# Patient Record
Sex: Female | Born: 1960 | Race: White | Hispanic: No | Marital: Married | Smoking: Former smoker
Health system: Southern US, Community
[De-identification: ages and names within clinical notes are randomized; demographics above are authoritative.]

## PROBLEM LIST (undated history)

## (undated) DIAGNOSIS — E119 Type 2 diabetes mellitus without complications: Secondary | ICD-10-CM

## (undated) DIAGNOSIS — I10 Essential (primary) hypertension: Secondary | ICD-10-CM

## (undated) HISTORY — PX: GASTRIC BYPASS: SHX52

## (undated) HISTORY — PX: CHOLECYSTECTOMY: SHX55

---

## 2013-05-11 DIAGNOSIS — E1159 Type 2 diabetes mellitus with other circulatory complications: Secondary | ICD-10-CM | POA: Insufficient documentation

## 2013-05-11 DIAGNOSIS — Z9884 Bariatric surgery status: Secondary | ICD-10-CM | POA: Insufficient documentation

## 2013-05-11 DIAGNOSIS — E669 Obesity, unspecified: Secondary | ICD-10-CM | POA: Insufficient documentation

## 2013-05-11 DIAGNOSIS — M545 Low back pain, unspecified: Secondary | ICD-10-CM | POA: Insufficient documentation

## 2014-02-13 ENCOUNTER — Ambulatory Visit: Payer: Self-pay | Admitting: Family Medicine

## 2014-03-04 DIAGNOSIS — E119 Type 2 diabetes mellitus without complications: Secondary | ICD-10-CM | POA: Insufficient documentation

## 2014-05-13 DIAGNOSIS — R002 Palpitations: Secondary | ICD-10-CM | POA: Insufficient documentation

## 2014-05-13 DIAGNOSIS — R42 Dizziness and giddiness: Secondary | ICD-10-CM | POA: Insufficient documentation

## 2014-05-13 DIAGNOSIS — R61 Generalized hyperhidrosis: Secondary | ICD-10-CM | POA: Insufficient documentation

## 2016-12-30 ENCOUNTER — Other Ambulatory Visit: Payer: Self-pay | Admitting: Gastroenterology

## 2016-12-30 DIAGNOSIS — K219 Gastro-esophageal reflux disease without esophagitis: Secondary | ICD-10-CM

## 2017-01-06 ENCOUNTER — Ambulatory Visit: Admission: RE | Admit: 2017-01-06 | Payer: Self-pay | Source: Ambulatory Visit

## 2018-03-13 ENCOUNTER — Encounter: Payer: Self-pay | Admitting: Emergency Medicine

## 2018-03-13 ENCOUNTER — Emergency Department
Admission: EM | Admit: 2018-03-13 | Discharge: 2018-03-13 | Disposition: A | Discharge status: Home / Self Care | Payer: BLUE CROSS/BLUE SHIELD | Attending: Emergency Medicine | Admitting: Emergency Medicine

## 2018-03-13 ENCOUNTER — Emergency Department: Payer: BLUE CROSS/BLUE SHIELD

## 2018-03-13 ENCOUNTER — Other Ambulatory Visit: Payer: Self-pay

## 2018-03-13 DIAGNOSIS — R61 Generalized hyperhidrosis: Secondary | ICD-10-CM | POA: Insufficient documentation

## 2018-03-13 DIAGNOSIS — I10 Essential (primary) hypertension: Secondary | ICD-10-CM | POA: Insufficient documentation

## 2018-03-13 DIAGNOSIS — Z87891 Personal history of nicotine dependence: Secondary | ICD-10-CM | POA: Insufficient documentation

## 2018-03-13 DIAGNOSIS — R42 Dizziness and giddiness: Secondary | ICD-10-CM

## 2018-03-13 DIAGNOSIS — E119 Type 2 diabetes mellitus without complications: Secondary | ICD-10-CM | POA: Diagnosis not present

## 2018-03-13 HISTORY — DX: Essential (primary) hypertension: I10

## 2018-03-13 HISTORY — DX: Type 2 diabetes mellitus without complications: E11.9

## 2018-03-13 LAB — BASIC METABOLIC PANEL
Anion gap: 11 (ref 5–15)
BUN: 28 mg/dL — AB (ref 6–20)
CALCIUM: 8.7 mg/dL — AB (ref 8.9–10.3)
CHLORIDE: 100 mmol/L — AB (ref 101–111)
CO2: 25 mmol/L (ref 22–32)
CREATININE: 0.85 mg/dL (ref 0.44–1.00)
GFR calc Af Amer: >60 mL/min (ref >60)
Glucose, Bld: 172 mg/dL — ABNORMAL HIGH (ref 65–99)
Potassium: 4 mmol/L (ref 3.5–5.1)
SODIUM: 136 mmol/L (ref 135–145)

## 2018-03-13 LAB — URINALYSIS, ROUTINE W REFLEX MICROSCOPIC
Bacteria, UA: NONE SEEN
Bilirubin Urine: NEGATIVE
GLUCOSE, UA: 50 mg/dL — AB
KETONES UR: 5 mg/dL — AB
Leukocytes, UA: NEGATIVE
NITRITE: NEGATIVE
PH: 6 (ref 5.0–8.0)
PROTEIN: 30 mg/dL — AB
SPECIFIC GRAVITY, URINE: 1.021 (ref 1.005–1.030)

## 2018-03-13 LAB — CBC
HCT: 41 % (ref 35.0–47.0)
HEMOGLOBIN: 13.9 g/dL (ref 12.0–16.0)
MCH: 31.1 pg (ref 26.0–34.0)
MCHC: 34 g/dL (ref 32.0–36.0)
MCV: 91.5 fL (ref 80.0–100.0)
PLATELETS: 303 10*3/uL (ref 150–440)
RBC: 4.48 MIL/uL (ref 3.80–5.20)
RDW: 13 % (ref 11.5–14.5)
WBC: 4.8 10*3/uL (ref 3.6–11.0)

## 2018-03-13 LAB — TSH: TSH: 1.529 u[IU]/mL (ref 0.350–4.500)

## 2018-03-13 LAB — GLUCOSE, CAPILLARY: Glucose-Capillary: 169 mg/dL — ABNORMAL HIGH (ref 65–99)

## 2018-03-13 LAB — TROPONIN I: Troponin I: <0.03 ng/mL (ref <0.03)

## 2018-03-13 MED ORDER — METOPROLOL TARTRATE 5 MG/5ML IV SOLN: 10.0000 mg | Freq: Once | INTRAVENOUS | Status: DC

## 2018-03-13 MED ORDER — METOPROLOL TARTRATE 25 MG PO TABS: 25.0000 mg | ORAL_TABLET | Freq: Two times a day (BID) | ORAL | 0 refills | Status: DC

## 2018-03-13 MED ORDER — METOPROLOL TARTRATE 25 MG PO TABS
25.0000 mg | ORAL_TABLET | Freq: Once | ORAL | Status: AC
  Administered 2018-03-13: 25 mg via ORAL
  Filled 2018-03-13: qty 1

## 2018-03-13 NOTE — Discharge Instructions (Addendum)
Please start taking the metoprolol as prescribed this evening.  Follow-up with your primary care physician for continued evaluation of your high blood pressure.  Return to the emergency department if you develop severe pain, lightheadedness or fainting, chest pain, shortness of breath, palpitations, fever, or any other symptoms concerning to you.

## 2018-03-13 NOTE — ED Notes (Signed)
Pt arrives to ED for hypertension at home with reported tachycardia to the 130's. Pt reports her PCP took her off her BP meds last week due to the cough she has been having for over 1 year. Pt denies being on any BP meds at this time. Pt reports dyspnea on exertion but denies any CP. Pt reports her heart has been racing occasionally. Pt is calm and cooperative. Pt denies N/V/D or fever/chills. VSS. Pt placed on cardiac monitor and call bell within reach. Will continue to monitor.

## 2018-03-13 NOTE — ED Provider Notes (Signed)
[facility] Emergency Department Provider Note  ____________________________________________  Time seen: Approximately 2:23 PM  I have reviewed the triage vital signs and the nursing notes.   HISTORY  Chief Complaint Hypertension    HPI Courtney Foley is a 57 y.o. female with a history of HTN and DM, chronic cough, presenting for diaphoresis and lightheadedness.  The patient reports that she has had a chronic cough for 3 years.  Her primary care physician has evaluated for multiple possible etiologies including infection, allergies, reflux, and medication causes of cough.  No causes for her cough have been found yet.  2 weeks ago, the patient's primary care physician discontinue lisinopril as one possible cause of her cough, but did not restart her on any other blood pressure medications.  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  She did not have any chest pain pressure or tightness.  She has not had any lower extremity swelling or calf pain.  She has not had any illness including fever or chills, congestion or rhinorrhea.  She checked her blood pressure and her systolic was over 200 so she came to the emergency department for further evaluation.  At this time, the patient feels back to baseline.  On my examination repeat blood pressure was 190/112.  Past Medical History:  Diagnosis Date  . Diabetes mellitus without complication (HCC)   . Hypertension     There are no active problems to display for this patient.   Past Surgical History:  Procedure Laterality Date  . GASTRIC BYPASS        Allergies Ibuprofen  No family history on file.  Social History Social History   Tobacco Use  . Smoking status: Former Games developer  . Smokeless tobacco: Never Used  Substance Use Topics  . Alcohol use: Yes    Comment: daily  . Drug use: Not on file    Review of  Systems Constitutional: No fever/chills.  Positive lightheadedness without syncope.  Positive diaphoresis. Eyes: No visual changes.  No blurred or double vision. ENT: No sore throat. No congestion or rhinorrhea.  No ear pain. Cardiovascular: Denies chest pain. Denies palpitations. Respiratory: Positive shortness of breath.  Positive chronic unchanged cough. Gastrointestinal: No abdominal pain.  No nausea, no vomiting.  No diarrhea.  No constipation. Genitourinary: Negative for dysuria. Musculoskeletal: Negative for back pain.  No lower extremity swelling or calf pain. Skin: Negative for rash. Neurological: Negative for headaches. No focal numbness, tingling or weakness.  No changes in vision, speech or mental status.  No difficulty walking.    ____________________________________________   PHYSICAL EXAM:  VITAL SIGNS: ED Triage Vitals [03/13/18 1141]  Enc Vitals Group     BP (!) 169/106     Pulse Rate (!) 110     Resp 18     Temp 97.6 F (36.4 C)     Temp Source Oral     SpO2 97 %     Weight 220 lb (99.8 kg)     Height  (1.651 m)     Head Circumference      Peak Flow      Pain Score 0     Pain Loc      Pain Edu?      Excl. in GC?     Constitutional: Alert and oriented.  Chronically ill appearing but in no acute distress. Answers questions appropriately. Eyes: Conjunctivae are  normal.  EOMI. No scleral icterus. Head: Atraumatic. Nose: No congestion/rhinnorhea. Mouth/Throat: Mucous membranes are moist.  Neck: No stridor.  Supple.  No JVD.  No meningismus. Cardiovascular: Normal rate, regular rhythm. No murmurs, rubs or gallops.  Respiratory: Normal respiratory effort.  No accessory muscle use or retractions. Lungs CTAB.  No wheezes, rales or ronchi. Gastrointestinal: Morbidly obese.  Soft, nontender and nondistended.  No guarding or rebound.  No peritoneal signs. Musculoskeletal: No LE edema. No ttp in the calves or palpable cords.  Negative Homan's  sign. Neurologic:  A&Ox3.  Speech is clear.  Face and smile are symmetric.  EOMI.  Moves all extremities well. Skin:  Skin is warm, dry and intact. No rash noted. Psychiatric: Mood and affect are normal. Speech and behavior are normal.  Normal judgement  ____________________________________________   LABS (all labs ordered are listed, but only abnormal results are displayed)  Labs Reviewed  BASIC METABOLIC PANEL - Abnormal; Notable for the following components:      Result Value   Chloride 100 (*)    Glucose, Bld 172 (*)    BUN 28 (*)    Calcium 8.7 (*)    All other components within normal limits  URINALYSIS, ROUTINE W REFLEX MICROSCOPIC - Abnormal; Notable for the following components:   Color, Urine YELLOW (*)    APPearance HAZY (*)    Glucose, UA 50 (*)    Hgb urine dipstick SMALL (*)    Ketones, ur 5 (*)    Protein, ur 30 (*)    All other components within normal limits  GLUCOSE, CAPILLARY - Abnormal; Notable for the following components:   Glucose-Capillary 169 (*)    All other components within normal limits  CBC  TSH  TROPONIN I  CBG MONITORING, ED   ____________________________________________  EKG  ED ECG REPORT I, Rockne Menghini, the attending physician, personally viewed and interpreted this ECG.   Date: 03/13/2018  EKG Time: 1152  Rate: 106  Rhythm: sinus tachycardia  Axis: leftward  Intervals:none  ST&T Change: no STEMI  ____________________________________________  RADIOLOGY  Dg Chest 2 View  Result Date: 03/13/2018 CLINICAL DATA:  Hypertension and tachycardia EXAM: CHEST - 2 VIEW COMPARISON:  None. FINDINGS: The heart size and mediastinal contours are within normal limits. Both lungs are clear. The visualized skeletal structures are unremarkable. IMPRESSION: No active cardiopulmonary disease. Electronically Signed   By: Alcide Clever M.D.   On: 03/13/2018 15:43     ____________________________________________   PROCEDURES  Procedure(s) performed: None  Procedures  Critical Care performed: No ____________________________________________   INITIAL IMPRESSION / ASSESSMENT AND PLAN / ED COURSE  Pertinent labs & imaging results that were available during my care of the patient were reviewed by me and considered in my medical decision making (see chart for details).  57 y.o. female with a history of hypertension, recently with antihypertensive discontinued, presenting with an episode of diaphoresis, lightheadedness and shortness of breath.  Overall, the patient is markedly hypertensive here.  She does not have any signs of ischemia on her EKG although she was in sinus tachycardia; at this time she is in normal sinus rhythm.  There is no evidence for ischemia, and her troponin is pending.  She is not hypoxic and has no evidence of DVT; PE is very unlikely.  It is possible that the patient had an episode of symptomatic hypertension this morning and we will treat her with metoprolol.  The patient's CBC shows normal blood counts, her electrolytes with the exception  of hyperglycemia reassuring.  She does not have evidence of infection on her urinalysis.  We will plan to treat the patient's blood pressure, and continue her diagnostic work-up.  Plan reevaluation for final disposition.  I have asked the clerk to call her primary care physician for final blood pressure medication management.  ----------------------------------------- 3:47 PM on 03/13/2018 -----------------------------------------  I spoken with Dr. Nigel Mormon, the patient's PMD, who agrees with the plan of initiating metoprolol with follow-up within the next 5 to 7 days.  ____________________________________________  FINAL CLINICAL IMPRESSION(S) / ED DIAGNOSES  Final diagnoses:  Diaphoresis  Hypertension, unspecified type  Lightheadedness         NEW MEDICATIONS STARTED DURING  THIS VISIT:  New Prescriptions   METOPROLOL TARTRATE (LOPRESSOR) 25 MG TABLET    Take 1 tablet (25 mg total) by mouth 2 (two) times daily.      Rockne Menghini, MD 03/13/18 1547

## 2018-03-13 NOTE — ED Triage Notes (Signed)
Pt states dry cough for about 3 years now, was taken off bp med 2 weeks ago to see if that was the cause of her cough, pt coughing in triage, diaphoretic, bp elevated. Denies pain, appears in NAD.

## 2018-09-12 IMAGING — CR DG CHEST 2V
2 series · 2 of 2 positions shown · non-contrast
Comparison: None.

CLINICAL DATA: Hypertension and tachycardia

EXAM:
CHEST - 2 VIEW

[chest pa]
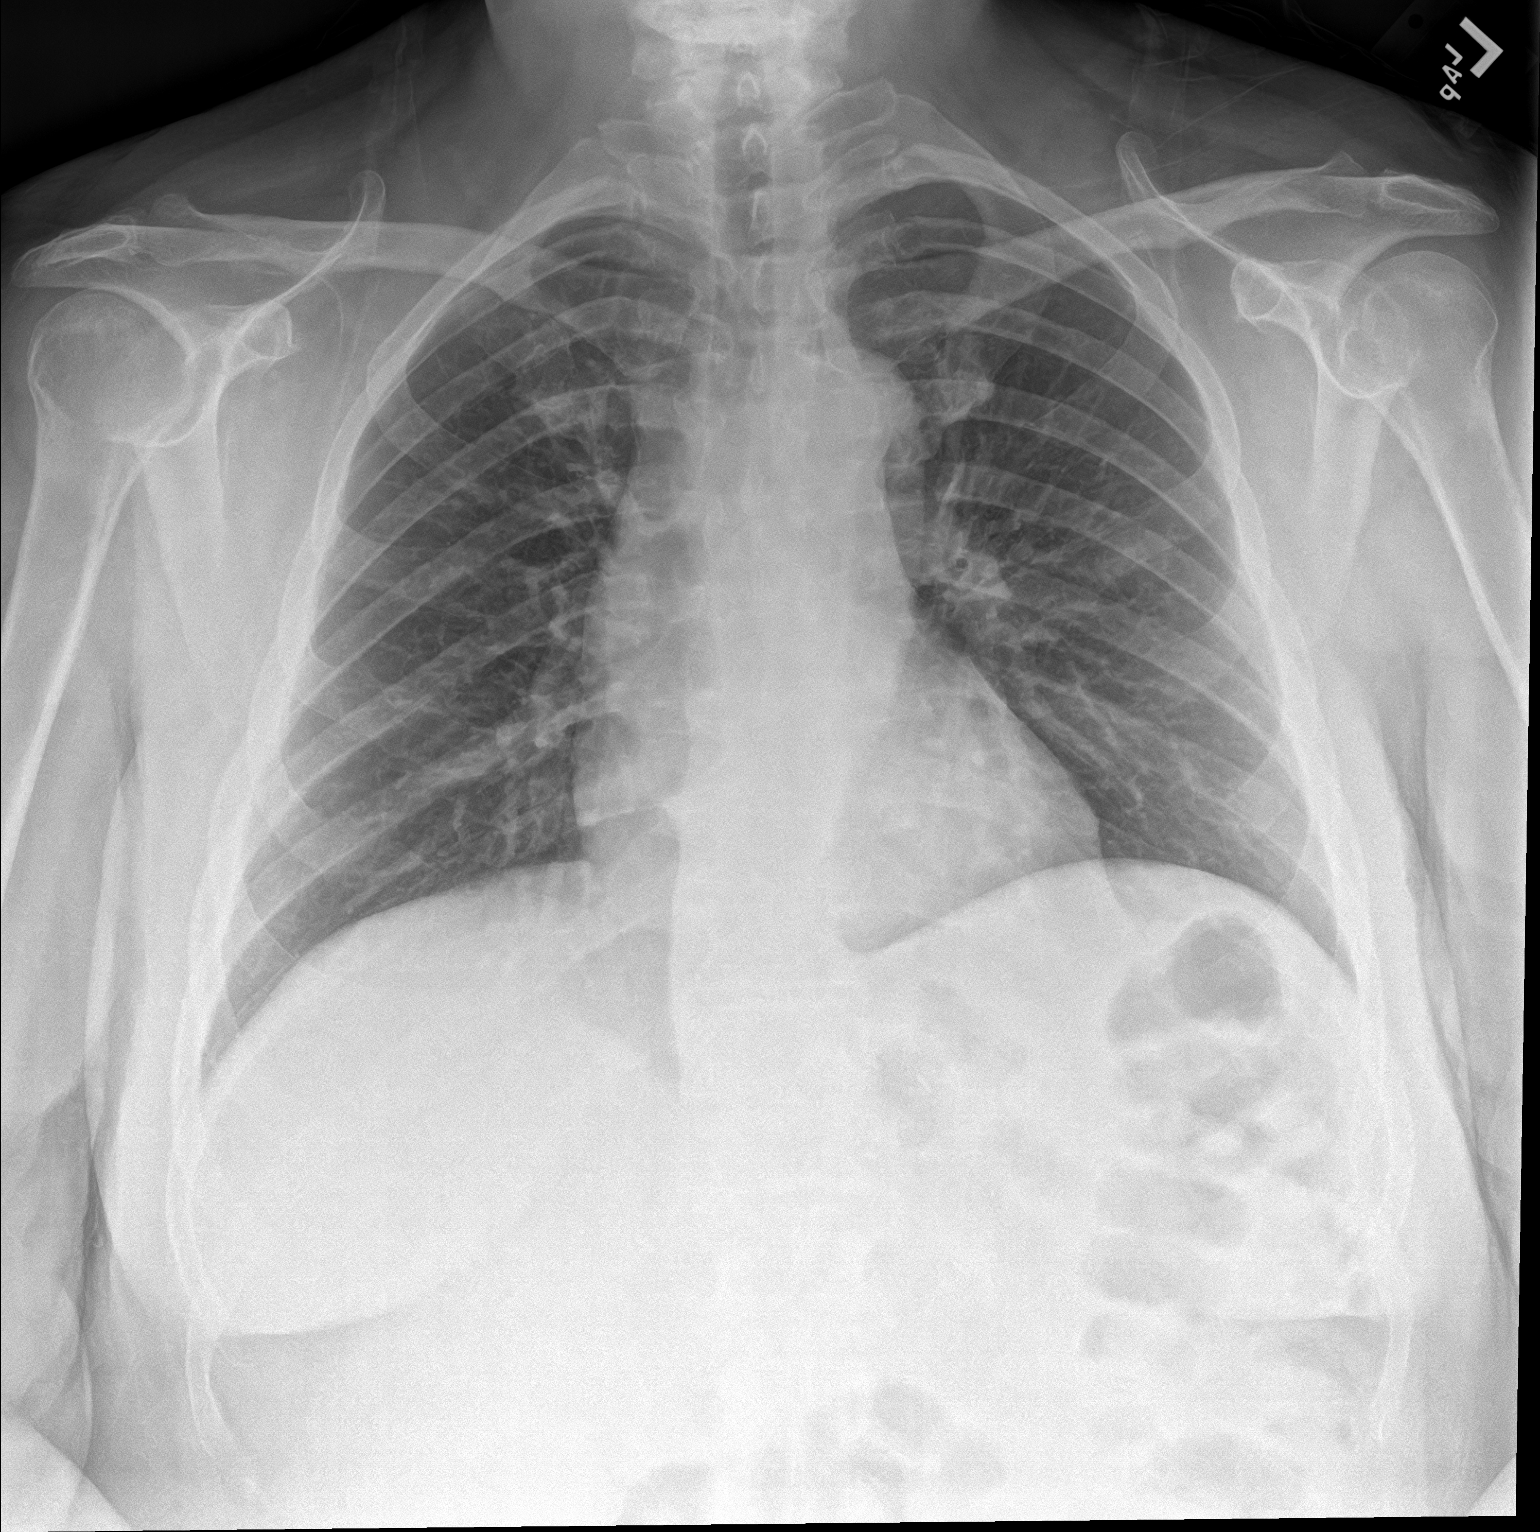

[chest lat]
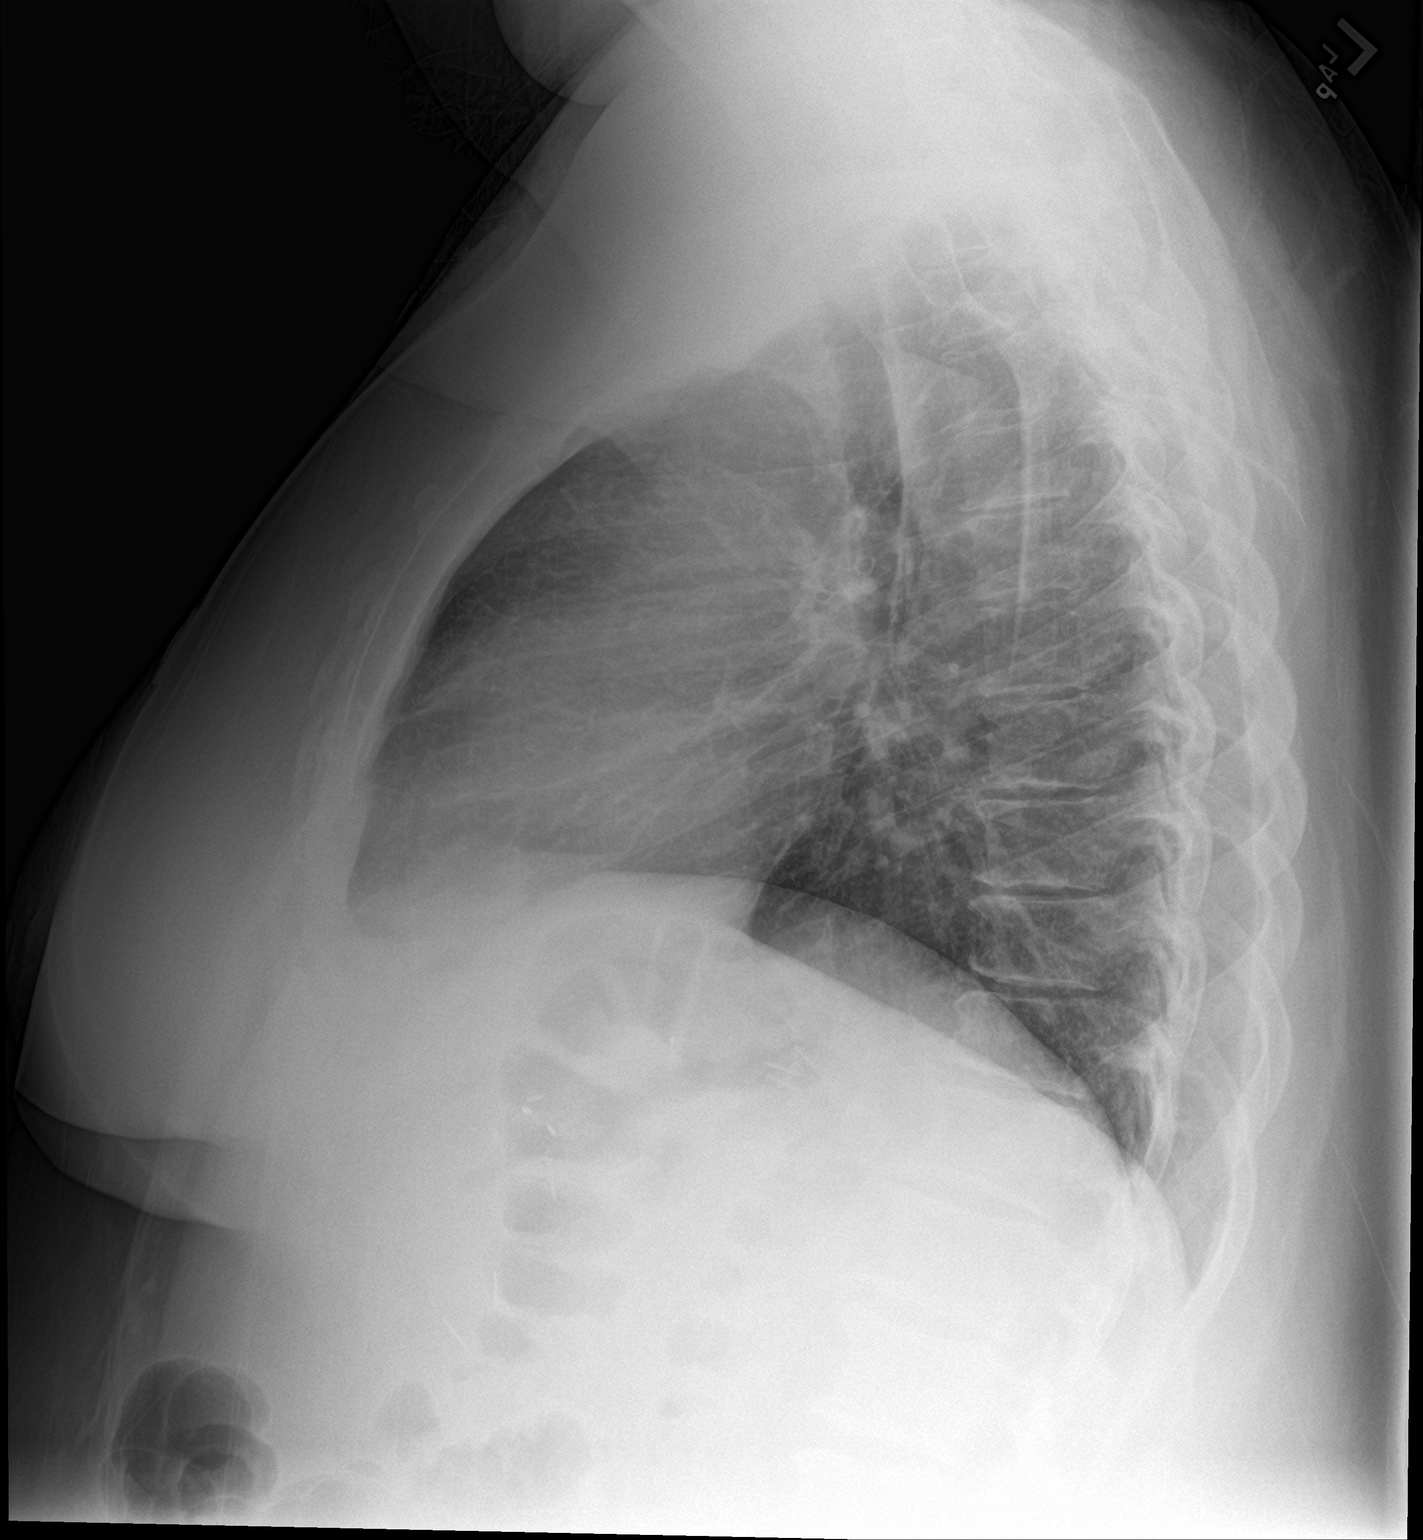

[2 of 2 positions shown; findings below may reference images not displayed]

FINDINGS: The heart size and mediastinal contours are within normal limits.
Both lungs are clear. The visualized skeletal structures are
unremarkable.
IMPRESSION: No active cardiopulmonary disease.

## 2020-12-17 DIAGNOSIS — K828 Other specified diseases of gallbladder: Secondary | ICD-10-CM | POA: Insufficient documentation

## 2021-09-27 DIAGNOSIS — Z0000 Encounter for general adult medical examination without abnormal findings: Secondary | ICD-10-CM | POA: Insufficient documentation

## 2022-01-04 DIAGNOSIS — E1169 Type 2 diabetes mellitus with other specified complication: Secondary | ICD-10-CM | POA: Insufficient documentation

## 2022-06-11 DIAGNOSIS — R1110 Vomiting, unspecified: Secondary | ICD-10-CM | POA: Insufficient documentation

## 2023-04-11 ENCOUNTER — Other Ambulatory Visit: Payer: Self-pay

## 2023-04-14 NOTE — Progress Notes (Signed)
Celso Amy, PA-C [address]  [address]  [place], [place] [address]  Main: [phone]  Fax: [phone]   Gastroenterology Consultation  Referring Provider:     Maree Erie, MD Primary Care Physician:  Maree Erie, MD Primary Gastroenterologist:  Celso Amy, PA-C / Dr. Wyline Mood  Reason for Consultation:     Chronic vomiting, chronic diarrhea, chronic abdominal pain        HPI:   Courtney Foley is a 62 y.o. y/o female referred for consultation & management  by Maree Erie, MD. PCP is at [facility] in [place].  Here to evaluate vomiting.  Drinks moderate alcohol 4 times per week.  1 bottle of wine per day.  Former cigarette smoker.  Nausea improved after stopping metformin.  Takes cholestyramine for chronic diarrhea.  History of Roux-en-Y gastric bypass in 2005.  History of chronic vomiting.  Has hypertension, hyperlipidemia, type 2 diabetes with long-term use of insulin.  Last saw PCP 03/18/2023 and started [facility] for weight loss.  Last EGD 06/2016 (for chronic vomiting and GERD) -LA grade B esophagitis, gastric bypass, gastrojejunal anastomosis was characterized by edema, erythema, friable mucosa, and inflammation.  Normal jejunum.  Performed by Dr. Andrey Farmer through [facility].  Was treated with Nexium 40 Mg daily.  Last colonoscopy 06/2016 (to evaluate chronic diarrhea) -sigmoid diverticulosis, otherwise normal.  Biopsies negative for microscopic colitis.   Colonoscopy in 2015 showed 9 mm polyp removed from sigmoid colon.  Abdominal MRI/MRCP 12/2020 (to evaluate chronic abdominal pain) showed diffuse hepatic steatosis, contracted gallbladder, cholelithiasis, porcelain gallbladder.  No evidence of cholecystitis.  Vascular shunt in the right hepatic lobe.  She underwent cholecystectomy 01/2021.  Had increased diarrhea after that.  Current symptoms: She reports generalized abdominal pain all over her entire upper and lower  abdomen bilaterally.  She has persistent episodes of nausea, vomiting, and diarrhea.  She denies rectal bleeding or weight loss.  No recent antibiotic use.  She has well water.  She is taking B12 injection.  No other vitamin supplements.  Past Medical History:  Diagnosis Date   Diabetes mellitus without complication (HCC)    Hypertension     Past Surgical History:  Procedure Laterality Date   GASTRIC BYPASS      Prior to Admission medications   Medication Sig Start Date End Date Taking? Authorizing Provider  metoprolol tartrate (LOPRESSOR) 25 MG tablet Take 1 tablet (25 mg total) by mouth 2 (two) times daily. 03/13/18 03/13/19  Rockne Menghini, MD    No family history on file.   Social History   Tobacco Use   Smoking status: Former   Smokeless tobacco: Never  Substance Use Topics   Alcohol use: Yes    Comment: daily    Allergies as of 04/15/2023 - Review Complete 03/13/2018  Allergen Reaction Noted   Ibuprofen Nausea And Vomiting and Other (See Comments) 05/11/2013   Lisinopril Cough 09/22/2019   Amlodipine Cough 11/12/2022   Losartan Cough 11/12/2022   Metformin Other (See Comments) 11/12/2022   Nickel Rash 05/13/2016   Statins Other (See Comments) 11/12/2022    Review of Systems:    All systems reviewed and negative except where noted in HPI.   Physical Exam:  There were no vitals taken for this visit. No LMP recorded. Patient is postmenopausal. Psych:  Alert and cooperative. Normal mood and affect. General:   Alert,  Well-developed, well-nourished, pleasant and cooperative in NAD Head:  Normocephalic and atraumatic. Eyes:  Sclera clear, no icterus.   Conjunctiva pink. Neck:  Supple; no masses or thyromegaly. Lungs:  Respirations even and unlabored.  Clear throughout to auscultation.   No wheezes, crackles, or rhonchi. No acute distress. Heart:  Regular rate and rhythm; no murmurs, clicks, rubs, or gallops. Abdomen:  Normal bowel sounds.  No bruits.  Soft,  and non-distended without masses, hepatosplenomegaly or hernias noted.  No Tenderness.  No guarding or rebound tenderness.    Neurologic:  Alert and oriented x3;  grossly normal neurologically. Psych:  Alert and cooperative. Normal mood and affect.  Imaging Studies: No results found.  Assessment and Plan:   Courtney Foley is a 62 y.o. y/o female has been referred for chronic nausea, vomiting, diarrhea, and abdominal pain for many years.  Differential includes irritable bowel syndrome, bile salt diarrhea postcholecystectomy, infectious diarrhea, pancreatic insufficiency, adverse effects of alcohol, diabetic gastroparesis, adhesions from abdominal surgery.  Last colonoscopy in 2017 showed biopsies negative for microscopic colitis.  No evidence of IBD.  EGD in 2017 unrevealing.  I am starting her workup with lab work, stool studies, and gastric emptying test.  Also giving dicyclomine.  Continue cholestyramine.  Strongly encouraged her to stop alcohol.  1.  Chronic vomiting  Gastric emptying study -evaluate for gastroparesis  Possible side effect of Mounjaro?  2.  Chronic diarrhea  Continue cholestyramine  Previous colonoscopy negative for microscopic colitis  Stool studies: GI pathogen panel, C. difficile PCR, pancreatic fecal elastase.  Labs: Celiac  3.  Chronic abdominal pain -differential includes IBS versus adhesions from abdominal surgery  Labs: CBC, CMP, lipase  4.  History of gastric bypass in 2005; cholecystectomy in 2022  Labs: Vitamin D, B12, magnesium, iron  5.  Hepatic steatosis   6.  Moderate alcohol use  Lengthy discussion regarding abstinence from alcohol.  Discussed resources such as Alcoholics Anonymous and fellowship all.  Follow up in 4 weeks with TG.  Celso Amy, PA-C

## 2023-04-15 ENCOUNTER — Ambulatory Visit: Payer: BLUE CROSS/BLUE SHIELD | Admitting: Physician Assistant

## 2023-04-15 ENCOUNTER — Encounter: Payer: Self-pay | Admitting: Physician Assistant

## 2023-04-15 VITALS — BP 142/98 | HR 105 | Temp 98.3°F | Ht 65.0 in | Wt 232.0 lb

## 2023-04-15 DIAGNOSIS — R1084 Generalized abdominal pain: Secondary | ICD-10-CM

## 2023-04-15 DIAGNOSIS — F1010 Alcohol abuse, uncomplicated: Secondary | ICD-10-CM

## 2023-04-15 DIAGNOSIS — R197 Diarrhea, unspecified: Secondary | ICD-10-CM

## 2023-04-15 DIAGNOSIS — R112 Nausea with vomiting, unspecified: Secondary | ICD-10-CM | POA: Diagnosis not present

## 2023-04-15 DIAGNOSIS — Z9884 Bariatric surgery status: Secondary | ICD-10-CM | POA: Diagnosis not present

## 2023-04-15 DIAGNOSIS — K760 Fatty (change of) liver, not elsewhere classified: Secondary | ICD-10-CM

## 2023-04-15 MED ORDER — DICYCLOMINE HCL 10 MG PO CAPS
10.0000 mg | ORAL_CAPSULE | Freq: Three times a day (TID) | ORAL | 2 refills | Status: AC
Start: 2023-04-15 — End: 2023-07-14

## 2023-04-15 NOTE — Patient Instructions (Signed)
Please do not eat or drink after midnight the night before your image test.

## 2023-04-19 ENCOUNTER — Telehealth: Payer: Self-pay

## 2023-04-19 NOTE — Telephone Encounter (Signed)
Called [facility] and they pended the case because of the facility being out of network. They said the provider would have to call at [phone] option 1,1, 2 they said the facility is out of network pending case number is [id number]. . They could not give me another facility that would be in network either

## 2023-04-22 NOTE — Telephone Encounter (Signed)
Called to check to see if they approved the facility and they did not they denied [facility]

## 2023-04-25 NOTE — Telephone Encounter (Signed)
[facility] and they do perform gastric emptying study through radiology and there number is [phone].  There NPI number is [id number] and fax number is [phone] . Called patient because patient insurance is out of network for [place] and this was not caught at check in. Called and informed patient and informed patient she had to go to a [facility] provider or she might get a out of nextwork charge. She asked how much she is going to have to pay for the visit from 04/15/2023 looked and it is still pending with insurance. She is going to call referring provider and get them to refer her to [facility].,  Did call [facility] and get the gastric emptying study approved from 04/25/2023 to 05/24/2023 Order number [id number].  Canceled follow up appointment and gastric emptying study with [place]

## 2023-05-12 ENCOUNTER — Telehealth: Payer: Self-pay

## 2023-05-12 NOTE — Telephone Encounter (Signed)
Courtney Foley left a message on voicemail to get permission to cancel to Gastric emptying study since insurance is out of network for [place]. Called donna back and informed her yes we can cancel the order

## 2023-05-16 ENCOUNTER — Ambulatory Visit: Payer: BLUE CROSS/BLUE SHIELD | Admitting: Physician Assistant

## 2024-04-11 LAB — COLOGUARD: COLOGUARD: NEGATIVE

## 2024-04-19 ENCOUNTER — Ambulatory Visit: Attending: Family Medicine

## 2024-04-19 DIAGNOSIS — R42 Dizziness and giddiness: Secondary | ICD-10-CM | POA: Insufficient documentation

## 2024-04-19 DIAGNOSIS — M6281 Muscle weakness (generalized): Secondary | ICD-10-CM | POA: Diagnosis present

## 2024-04-19 DIAGNOSIS — R262 Difficulty in walking, not elsewhere classified: Secondary | ICD-10-CM | POA: Insufficient documentation

## 2024-04-19 DIAGNOSIS — R2681 Unsteadiness on feet: Secondary | ICD-10-CM | POA: Diagnosis present

## 2024-04-19 DIAGNOSIS — M542 Cervicalgia: Secondary | ICD-10-CM | POA: Insufficient documentation

## 2024-04-19 NOTE — Therapy (Signed)
 OUTPATIENT PHYSICAL THERAPY VESTIBULAR TREATMENT     Patient Name: Courtney Foley MRN: [id number] DOB:04-20-1961, 63 y.o., female Today's Date: 04/19/2024  END OF SESSION:  PT End of Session - 04/19/24 1418     Visit Number 1    Number of Visits 25    Date for PT Re-Evaluation 07/12/24    PT Start Time 1151    PT Stop Time 1230    PT Time Calculation (min) 39 min    Activity Tolerance Patient tolerated treatment well    Behavior During Therapy Restless          Past Medical History:  Diagnosis Date   Diabetes mellitus without complication (HCC)    Hypertension    Past Surgical History:  Procedure Laterality Date   CHOLECYSTECTOMY     GASTRIC BYPASS     Patient Active Problem List   Diagnosis Date Noted   Chronic vomiting 06/11/2022   Hyperlipidemia associated with type 2 diabetes mellitus (HCC) 01/04/2022   Routine health maintenance 09/27/2021   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   Diaphoresis 05/13/2014   Dizziness 05/13/2014   Palpitations 05/13/2014   Diabetes mellitus (HCC) 03/04/2014   Hypertension associated with diabetes (HCC) 05/11/2013   Low back pain 05/11/2013   Obesity 05/11/2013   S/P gastric bypass 05/11/2013    PCP: Nancie Axe, MD REFERRING PROVIDER: Kurtis Philips, MD  REFERRING DIAG:  Diagnosis  R42 (ICD-10-CM) - Vertigo    THERAPY DIAG:  Dizziness and giddiness  Muscle weakness (generalized)  Unsteadiness on feet  Difficulty in walking, not elsewhere classified  ONSET DATE: 2023  Rationale for Evaluation and Treatment: Rehabilitation  SUBJECTIVE:   SUBJECTIVE STATEMENT:  The pt is a pleasant 63 y/o female presenting to PT for dizziness and imbalance. Pt reports symptom onset two years ago. Pt says nothing conclusive was found at recent ENT visit (she describes being tested for BPPV deficit/positional maneuvers). She reports nothing found on imaging during hospitalization (5/7) where pt had presented to ED with  positive HITNS exam (per chart). Pt reports CRM was provided by ENT and did make symptoms a little better. Pt says over past two years she's developed a few different symptoms: two years ago she started experiencing lots of pain, which turned into weakness and difficulty walking. She had some difficulty moving R shoulder as well (has had injections for chronic pain multiple sites). In May this year she reports her eyesight got screwed up, where she was seeing double and she had vertigo. Pt states, lights on the ceiling were down on the floor, and everything made her feel nauseated to the point she had to shut her eyes. She was given Meclizine at the time to tx symptoms and this helped some. Her vision has gradually improved since. Pt reports she does experience some headaches...about once a month and they don't last long. Pt reports hearing loss but has not had hearing tested. Her spouse is present for exam and reports he has noticed pt cannot hear him as well.. Regarding dizziness, pt reports she doesn't really experience spinning sensation, but doesn't like to be moved quickly as this can increase her symptoms. Pt reports she does experience light sensitivity during episodes and that she can become so nauseated she vomits. She has poor balance, particularly in the dark. She cannot close her eyes in the shower due to LOB. Other symptoms: pt reports bilateral tinnitus, increased ear wax with recent L ear wax removal that might have helped symptoms.   Pt accompanied  by: spouse  PERTINENT HISTORY:   Per chart PMH significant for DMII (on insulin), HTN, hx gastric bypass, chronic pain per pt report  PAIN:  Are you having pain? Pt does have neck pain, more on the R side than L side. She describes it has sharp. Pt reports pain comes and goes, is unsure how long she has had that for  PRECAUTIONS: Sternal  RED FLAGS: Pt reports changes in ability to swallow water and pills over past year; she also reports  urinary accidents >1 year but not as bad as before, states she has discussed with physicians    WEIGHT BEARING RESTRICTIONS: No  FALLS: Has patient fallen in last 6 months? No but she reports it's because she's been more cautious, moves sloly,, prior to that she was falling multiple times/week   LIVING ENVIRONMENT: Lives with: spouse, pets  Stairs: 2-3 with handrail on R  Has following equipment at home: Single point cane and Walker - 2 wheeled, grab bars in the shower and next to toilet, has a shower chair, has toilet riser  PLOF: Independent  PATIENT GOALS: to be able to walk better without constantly feeling she is going to fall, be able to balance in darkness, states she wants to be normal  OBJECTIVE:  Note: Objective measures were completed at Evaluation unless otherwise noted.  DIAGNOSTIC FINDINGS:   MRI BRAIN 03/07/24:  FINDINGS:   There are scattered and confluent foci of signal abnormality within the periventricular and deep white matter.  These are nonspecific but commonly seen with small vessel ischemic changes. Mild global parenchymal volume loss with ex vacuo ventricular dilation. Prominent perivascular spaces in the inferior basal ganglia bilaterally. There is no midline shift. No extra-axial fluid collection. No evidence of intracranial hemorrhage. No diffusion weighted signal abnormality to suggest acute infarct. No mass.  IMPRESSION: No acute intracranial abnormality. Chronic small vessel ischemic changes. Exam End: 03/07/24 17:00   Specimen Collected: 03/07/24 17:08 Last Resulted: 03/07/24 17:36  Received From: [facility]  Result Received: 04/17/24 13:14    ECG 03/07/24 Lyanne Sample, MD - 03/08/2024 Formatting of this note might be different from the original. SINUS RHYTHM WITH PREMATURE ATRIAL BEATS LEFT ANTERIOR FASCICULAR BLOCK MODERATE VOLTAGE CRITERIA FOR LVH, MAY BE NORMAL VARIANT ( R in aVL , [employer] product ) PROLONGED QT ABNORMAL  ECG WHEN COMPARED WITH ECG OF 28-Nov-2023 21:38, PREMATURE ATRIAL BEATS ARE NOW PRESENT BORDERLINE CRITERIA FOR ANTERIOR INFARCT  ARE NO LONGER PRESENT BORDERLINE CRITERIA FOR ANTEROLATERAL INFARCT  ARE NO LONGER PRESENT Confirmed by Antonetta Kitchen ([id number]) on 03/08/2024 11:35:52 AM Specimen Collected: 03/07/24 14:49   Performed by: [facility] RAD Last Resulted: 03/08/24 11:35  Received From: [facility]  Result Received: 04/17/24 13:14    COGNITION: Overall cognitive status: Within functional limits for tasks assessed     POSTURE:  Rounded shoulders, increased thoracic kyphosis   Cervical ROM:   Deferred  STRENGTH:   LOWER EXTREMITY MMT: deferred  MMT Right eval Left eval  Hip flexion    Hip abduction    Hip adduction    Hip internal rotation    Hip external rotation    Knee flexion    Knee extension    Ankle dorsiflexion    Ankle plantarflexion    Ankle inversion    Ankle eversion    (Blank rows = not tested)  BED MOBILITY:  Needs to be formally assessed, likely with difficulty based on pt subjective hx intake  TRANSFERS: Assistive device utilized:  None  Sit to stand: Modified independence increased BUE use, increased time Stand to sit: Modified independence Chair to chair: Modified independence   GAIT: Gait pattern: Pt with low back/L side hip pain, appears to offload LLE throughout gait, but overall increased lateral sway bilat throughout, decreased step-length, reaches out at times to touch wall to steady self  Distance walked: clinic distances Assistive device utilized: None *Reports on higher symptom days uses SPC or RW for gait   FUNCTIONAL TESTS: deferred to future visit/due to time 5 times sit to stand:   10 meter walk test:   Dynamic Gait Index:    PATIENT SURVEYS:  DHI to be completed next 1-2 visits  VESTIBULAR ASSESSMENT:   OCULOMOTOR EXAM:  Ocular Alignment: normal  Ocular ROM: No Limitations but testing does make pt slightly dizzy    Spontaneous Nystagmus: absent - does have end gaze nsytagmus   Gaze-Induced Nystagmus: appears present primary and secondary gaze bilat. Pt will need reassessment with vestibular goggles donned  Smooth Pursuits: overall smooth, some saccades   Saccades: intact  Convergence/Divergence: converges, sees double about 6 away   VESTIBULAR - OCULAR REFLEX:  deferred  Slow VOR:   VOR Cancellation:   Head-Impulse Test:   Dynamic Visual Acuity:    POSITIONAL TESTING: deferred    OTHOSTATICS: deferred                                                               TREATMENT DATE: 04/19/24  Self-Care/Home Management: -PT provided education on findings of today's assessment, indications, pt goals, plan -PT provided education on importance of finding movements/activities that are safe and comfortable for pt and don't provoke dizzy symptoms too much, then will plan to gradually progress these over time in PT    *pt did report feeling like she was starting to get a slight headache and felt some nausea at end of visit   PATIENT EDUCATION: Education details: findings of today's assessment, indications, pt goals, plan, see above for more info  Person educated: Patient Education method: Explanation Education comprehension: verbalized understanding  HOME EXERCISE PROGRAM:    GOALS: Goals reviewed with patient? Yes initiated, will continue to review as further testing completed  SHORT TERM GOALS: Target date: 05/31/2024   Patient will be independent in home exercise program to improve dizziness and balance/mobility for better functional independence with ADLs. Baseline: Goal status: INITIAL   LONG TERM GOALS: Target date: 07/12/2024     Patient will reduce dizziness handicap inventory score to <50, for less dizziness with ADLs and increased safety with home and work tasks.  Baseline: to be assessed  Goal status: INITIAL  2.  Patient will report at least a 30% reduction in positional  or movement-induced dizziness symptoms to indicate increased ease with ADLs and QOL Baseline: pt symptomatic  Goal status: INITIAL  3.  Patient will increase dynamic gait index score to >19/24 as to demonstrate reduced fall risk and improved dynamic gait balance for better safety with community/home ambulation.   Baseline: to be assessed next 1-2 visits Goal status: INITIAL  4.  Patient will increase ABC scale score >80% to demonstrate better functional mobility and better confidence with ADLs.  Baseline: to be assessed Goal status: INITIAL  5. The pt will demonstrate independence with  a home symptom management program to assist in decreasing or preventing further increase dizziness symptoms during a flare-up.  Baseline: no program currently in place   Goal status: INITIAL    ASSESSMENT:  CLINICAL IMPRESSION: Patient is a pleasant 63 y.o. female who was seen today for physical therapy evaluation and treatment for dizziness and imbalance. Assessment somewhat limited secondary to complex hx intake. Pt found to have abnormalities on oculomotor exam: end-gaze nystagmus, some saccadic movement with smooth pursuits, and abnormal convergence (sees double at 6). While oculomotor ROM was normal, testing did make pt feel slightly dizzy. These findings are suggestive of possible central contributors to dizziness. Per pt hx, pt also with light sensitivity, nausea, and headaches, features suggestive of possible VM. However, pt & spouse also report she has hearing loss but has not had hearing assessed in a long time. PT encourages pt to see an audiologist to further assess hearing as this could help narrow down possible causes of dizziness. Pt and her spouse agreeable to plan. Plan next visit to complete further assessment with vestibular goggles, particularly looking for nystagmus in primary and secondary gaze as this was present on exam, this will also assist in determining if there is peripheral component  to dizziness. While formal gait assessment deferred, pt noted to have multiple gait abnormalities upon observation and unsteadiness. The pt will benefit from further skilled PT to improve dizziness, balance, strength, gait and mobility to increase ease and safety with ADLS, QOL and decrease fall risk.  OBJECTIVE IMPAIRMENTS: Abnormal gait, decreased activity tolerance, decreased balance, decreased mobility, difficulty walking, decreased strength, dizziness, improper body mechanics, postural dysfunction, and pain.   ACTIVITY LIMITATIONS: standing, squatting, stairs, transfers, bed mobility, bathing, and locomotion level  PARTICIPATION LIMITATIONS: meal prep, cleaning, laundry, shopping, community activity, occupation, yard work, and pt reports general reduction in mobility and ability to complete ADLs due to symptoms  PERSONAL FACTORS: Age, Fitness, Sex, Time since onset of injury/illness/exacerbation, and 3+ comorbidities:  DMII (on insulin), HTN, chronic pain per pt report are also affecting patient's functional outcome.   REHAB POTENTIAL: Good  CLINICAL DECISION MAKING: Unstable/unpredictable  EVALUATION COMPLEXITY: High   PLAN:  PT FREQUENCY: 1-2x/week  PT DURATION: 12 weeks  PLANNED INTERVENTIONS: 97164- PT Re-evaluation, 97750- Physical Performance Testing, 97110-Therapeutic exercises, 97530- Therapeutic activity, [id number]- Neuromuscular re-education, 97535- Self Care, [id number]- Manual therapy, [id number]- Gait training, [phone]- Orthotic Initial, [phone]- Orthotic/Prosthetic subsequent, [phone]- Canalith repositioning, Patient/Family education, Balance training, Stair training, Taping, Joint mobilization, Spinal mobilization, Vestibular training, DME instructions, Cryotherapy, and Moist heat  PLAN FOR NEXT SESSION: complete further assessment: repeat some oculomotor testing (gaze induced nystagmus) with vestibular goggles to suppress fixation, horizontal head shake test, VOR testing, positional testing  if time, initiate HEP, DGI or DHI if time   Samie Crews, PT 04/19/2024, 4:52 PM

## 2024-04-24 ENCOUNTER — Ambulatory Visit

## 2024-04-26 ENCOUNTER — Ambulatory Visit

## 2024-04-26 DIAGNOSIS — M6281 Muscle weakness (generalized): Secondary | ICD-10-CM

## 2024-04-26 DIAGNOSIS — R42 Dizziness and giddiness: Secondary | ICD-10-CM | POA: Diagnosis not present

## 2024-04-26 DIAGNOSIS — M542 Cervicalgia: Secondary | ICD-10-CM

## 2024-04-26 NOTE — Therapy (Signed)
 OUTPATIENT PHYSICAL THERAPY VESTIBULAR TREATMENT     Patient Name: Courtney Foley MRN: [id number] DOB:1961-03-12, 63 y.o., female Today's Date: 04/26/2024  END OF SESSION:  PT End of Session - 04/26/24 1642     Visit Number 2    Number of Visits 25    Date for PT Re-Evaluation 07/12/24    PT Start Time 1150    PT Stop Time 1230    PT Time Calculation (min) 40 min    Activity Tolerance Patient tolerated treatment well;Patient limited by pain;Other (comment)   interventions & further assessment limited secondary to intensity of dizziness/nausea   Behavior During Therapy [facility] for tasks assessed/performed           Past Medical History:  Diagnosis Date   Diabetes mellitus without complication (HCC)    Hypertension    Past Surgical History:  Procedure Laterality Date   CHOLECYSTECTOMY     GASTRIC BYPASS     Patient Active Problem List   Diagnosis Date Noted   Chronic vomiting 06/11/2022   Hyperlipidemia associated with type 2 diabetes mellitus (HCC) 01/04/2022   Routine health maintenance 09/27/2021   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   Diaphoresis 05/13/2014   Dizziness 05/13/2014   Palpitations 05/13/2014   Diabetes mellitus (HCC) 03/04/2014   Hypertension associated with diabetes (HCC) 05/11/2013   Low back pain 05/11/2013   Obesity 05/11/2013   S/P gastric bypass 05/11/2013    PCP: Leopold Belvie RAMAN, MD REFERRING PROVIDER: Devra Channel, MD  REFERRING DIAG:  Diagnosis  R42 (ICD-10-CM) - Vertigo    THERAPY DIAG:  Dizziness and giddiness  Cervicalgia  Muscle weakness (generalized)  ONSET DATE: 2023  Rationale for Evaluation and Treatment: Rehabilitation  SUBJECTIVE:   SUBJECTIVE STATEMENT: Pt reports she's been very dizzy lately and has been experiencing headaches recently. She reports messed up eyes, especially if she looks to her right. She says it is somewhat better than it was before, since she can keep her eyes open more this time. No  falls, but does report stumbles.   Pt accompanied by: spouse  PERTINENT HISTORY:   From Eval:  The pt is a pleasant 63 y/o female presenting to PT for dizziness and imbalance. Pt reports symptom onset two years ago. Pt says nothing conclusive was found at recent ENT visit (she describes being tested for BPPV deficit/positional maneuvers). She reports nothing found on imaging during hospitalization (5/7) where pt had presented to ED with positive HITNS exam (per chart). Pt reports CRM was provided by ENT and did make symptoms a little better. Pt says over past two years she's developed a few different symptoms: two years ago she started experiencing lots of pain, which turned into weakness and difficulty walking. She had some difficulty moving R shoulder as well (has had injections for chronic pain multiple sites). In May this year she reports her eyesight got screwed up, where she was seeing double and she had vertigo. Pt states, lights on the ceiling were down on the floor, and everything made her feel nauseated to the point she had to shut her eyes. She was given Meclizine at the time to tx symptoms and this helped some. Her vision has gradually improved since. Pt reports she does experience some headaches...about once a month and they don't last long. Pt reports hearing loss but has not had hearing tested. Her spouse is present for exam and reports he has noticed pt cannot hear him as well.. Regarding dizziness, pt reports she doesn't really experience spinning sensation,  but doesn't like to be moved quickly as this can increase her symptoms. Pt reports she does experience light sensitivity during episodes and that she can become so nauseated she vomits. She has poor balance, particularly in the dark. She cannot close her eyes in the shower due to LOB. Other symptoms: pt reports bilateral tinnitus, increased ear wax with recent L ear wax removal that might have helped symptoms.   Per chart PMH  significant for DMII (on insulin), HTN, hx gastric bypass, chronic pain per pt report  PAIN:  Are you having pain? Pt does have neck pain, more on the R side than L side. She describes it has sharp. Pt reports pain comes and goes, is unsure how long she has had that for  PRECAUTIONS: Sternal  RED FLAGS: Pt reports changes in ability to swallow water and pills over past year; she also reports urinary accidents >1 year but not as bad as before, states she has discussed with physicians    WEIGHT BEARING RESTRICTIONS: No  FALLS: Has patient fallen in last 6 months? No but she reports it's because she's been more cautious, moves sloly,, prior to that she was falling multiple times/week   LIVING ENVIRONMENT: Lives with: spouse, pets  Stairs: 2-3 with handrail on R  Has following equipment at home: Single point cane and Walker - 2 wheeled, grab bars in the shower and next to toilet, has a shower chair, has toilet riser  PLOF: Independent  PATIENT GOALS: to be able to walk better without constantly feeling she is going to fall, be able to balance in darkness, states she wants to be normal  OBJECTIVE:  Note: Objective measures were completed at Evaluation unless otherwise noted.  DIAGNOSTIC FINDINGS:   MRI BRAIN 03/07/24:  FINDINGS:   There are scattered and confluent foci of signal abnormality within the periventricular and deep white matter.  These are nonspecific but commonly seen with small vessel ischemic changes. Mild global parenchymal volume loss with ex vacuo ventricular dilation. Prominent perivascular spaces in the inferior basal ganglia bilaterally. There is no midline shift. No extra-axial fluid collection. No evidence of intracranial hemorrhage. No diffusion weighted signal abnormality to suggest acute infarct. No mass.  IMPRESSION: No acute intracranial abnormality. Chronic small vessel ischemic changes. Exam End: 03/07/24 17:00   Specimen Collected: 03/07/24 17:08 Last  Resulted: 03/07/24 17:36  Received From: [facility]  Result Received: 04/17/24 13:14    ECG 03/07/24 Antonetta Okey Pac, MD - 03/08/2024 Formatting of this note might be different from the original. SINUS RHYTHM WITH PREMATURE ATRIAL BEATS LEFT ANTERIOR FASCICULAR BLOCK MODERATE VOLTAGE CRITERIA FOR LVH, MAY BE NORMAL VARIANT ( R in aVL , [employer] product ) PROLONGED QT ABNORMAL ECG WHEN COMPARED WITH ECG OF 28-Nov-2023 21:38, PREMATURE ATRIAL BEATS ARE NOW PRESENT BORDERLINE CRITERIA FOR ANTERIOR INFARCT  ARE NO LONGER PRESENT BORDERLINE CRITERIA FOR ANTEROLATERAL INFARCT  ARE NO LONGER PRESENT Confirmed by Antonetta Okey ([id number]) on 03/08/2024 11:35:52 AM Specimen Collected: 03/07/24 14:49   Performed by: [facility] Last Resulted: 03/08/24 11:35  Received From: [facility]  Result Received: 04/17/24 13:14    COGNITION: Overall cognitive status: Within functional limits for tasks assessed     POSTURE:  Rounded shoulders, increased thoracic kyphosis   Cervical ROM:   Deferred  STRENGTH:   LOWER EXTREMITY MMT: deferred  MMT Right eval Left eval  Hip flexion    Hip abduction    Hip adduction    Hip internal rotation  Hip external rotation    Knee flexion    Knee extension    Ankle dorsiflexion    Ankle plantarflexion    Ankle inversion    Ankle eversion    (Blank rows = not tested)  BED MOBILITY:  Needs to be formally assessed, likely with difficulty based on pt subjective hx intake  TRANSFERS: Assistive device utilized: None  Sit to stand: Modified independence increased BUE use, increased time Stand to sit: Modified independence Chair to chair: Modified independence   GAIT: Gait pattern: Pt with low back/L side hip pain, appears to offload LLE throughout gait, but overall increased lateral sway bilat throughout, decreased step-length, reaches out at times to touch wall to steady self  Distance walked: clinic distances Assistive device utilized:  None *Reports on higher symptom days uses SPC or RW for gait   FUNCTIONAL TESTS: deferred to future visit/due to time 5 times sit to stand:   10 meter walk test:   Dynamic Gait Index:    PATIENT SURVEYS:  DHI 82 from 04/26/2024  VESTIBULAR ASSESSMENT:   OCULOMOTOR EXAM:  Ocular Alignment: normal  Ocular ROM: No Limitations but testing does make pt slightly dizzy   Spontaneous Nystagmus: absent - does have end gaze nsytagmus   Gaze-Induced Nystagmus: appears present primary and secondary gaze bilat. Pt will need reassessment with vestibular goggles donned  Smooth Pursuits: overall smooth, some saccades   Saccades: intact  Convergence/Divergence: converges, sees double about 6 away    VESTIBULAR - OCULAR REFLEX:  04/26/2024  Slow VOR: some occ saccadic movement   VOR Cancellation: WNL   Head-Impulse Test: not completed due to neck pain    POSITIONAL TESTING: deferred    OTHOSTATICS: deferred                                                               TREATMENT DATE: 04/26/24  NMR:   DHI - 82  VESTIBULAR - OCULAR REFLEX:    Slow VOR: some occ saccadic movement   VOR Cancellation: WNL   Head-Impulse Test: not completed due to neck pain    Attempts seated VORx1 horizontal head turns- reports visual bouncing at rest although target is clear. Pt attempts 15, 20, and 30 sec rounds but becomes nauseated. Intervention discontinued.   TE:  LE MMT -  BLE strength is grossly 4/5, greatest deficits found proximal musculature     Cervical AROM assessment: Flexion - 43 deg Extension - 35 deg Rotation: R- 25 deg L- 20 deg Lateral flexion: R- 25 deg L- 20 deg *all movements are pain-limited   Palpation: Pt TTP in bilat posterior shoulder mm and bilat cervical paraspinals, TTP over mid-upper T-spine and throughout majority of c-spine   TA: PT provides HEP: pt completes 3x10 seated march in session, 1x10 seated hip abduction with red t.band in session, and PT provides  education on walking program, instructs pt to use her AD when ambulating and do not perform if feeling increasingly unsteady or dizzy  Access Code: [email or web address] URL: [email or web address] Date: 04/26/2024 Prepared by: Darryle Patten  Exercises - Seated March  - 1 x daily - 5-6 x weekly - 3 sets - 10 reps - Seated Hip Abduction with Resistance  - 1 x daily - 5-6 x weekly - 3 sets -  10 reps - Walking  - 1 x daily - 7 x weekly - 3 sets - 1 reps - 2 minutes  hold  Interventions limited throughout by nausea   PATIENT EDUCATION: Education details: further assessment, Person educated: Patient Education method: Explanation Education comprehension: verbalized understanding  HOME EXERCISE PROGRAM:    GOALS: Goals reviewed with patient? Yes initiated, will continue to review as further testing completed  SHORT TERM GOALS: Target date: 06/07/2024   Patient will be independent in home exercise program to improve dizziness and balance/mobility for better functional independence with ADLs. Baseline: Goal status: INITIAL   LONG TERM GOALS: Target date: 07/19/2024     Patient will reduce dizziness handicap inventory score to <50, for less dizziness with ADLs and increased safety with home and work tasks.  Baseline: to be assessed  Goal status: INITIAL  2.  Patient will report at least a 30% reduction in positional or movement-induced dizziness symptoms to indicate increased ease with ADLs and QOL Baseline: pt symptomatic  Goal status: INITIAL  3.  Patient will increase dynamic gait index score to >19/24 as to demonstrate reduced fall risk and improved dynamic gait balance for better safety with community/home ambulation.   Baseline: to be assessed next 1-2 visits Goal status: INITIAL  4.  Patient will increase ABC scale score >80% to demonstrate better functional mobility and better confidence with ADLs.  Baseline: to be assessed Goal status: INITIAL  5. The pt will demonstrate  independence with a home symptom management program to assist in decreasing or preventing further increase dizziness symptoms during a flare-up.  Baseline: no program currently in place   Goal status: INITIAL    ASSESSMENT:  CLINICAL IMPRESSION: Further assessment completed and adjusted/modified today as pt highly symptomatic on presentation (use of vestibular goggles deferred at this time as was head impulse test due to dizziness and neck pain). Pt found with some saccadic movements with slow VOR. DHI score was 82, indicating perception of severe impairment due to dizziness. Pt also found to have general pain-limitation with all cervical AROM and was TTP throughout cervical and shoulder musculature. MMT of BLE indicates BLE strength deficits. Attempted VORx1 horizontal head turns, but this made pt too symptomatic. PT initiated and provided pt with HEP to address strength and cardio deficits for symptom modulation with plan to try to progress back to VOR exercises over time. The pt will benefit from further skilled PT to improve dizziness, balance, strength, gait and mobility to increase ease and safety with ADLS, QOL and decrease fall risk.  OBJECTIVE IMPAIRMENTS: Abnormal gait, decreased activity tolerance, decreased balance, decreased mobility, difficulty walking, decreased strength, dizziness, improper body mechanics, postural dysfunction, and pain.   ACTIVITY LIMITATIONS: standing, squatting, stairs, transfers, bed mobility, bathing, and locomotion level  PARTICIPATION LIMITATIONS: meal prep, cleaning, laundry, shopping, community activity, occupation, yard work, and pt reports general reduction in mobility and ability to complete ADLs due to symptoms  PERSONAL FACTORS: Age, Fitness, Sex, Time since onset of injury/illness/exacerbation, and 3+ comorbidities:  DMII (on insulin), HTN, chronic pain per pt report are also affecting patient's functional outcome.   REHAB POTENTIAL: Good  CLINICAL  DECISION MAKING: Unstable/unpredictable  EVALUATION COMPLEXITY: High   PLAN:  PT FREQUENCY: 1-2x/week  PT DURATION: 12 weeks  PLANNED INTERVENTIONS: [id number]- PT Re-evaluation, 97750- Physical Performance Testing, 97110-Therapeutic exercises, 97530- Therapeutic activity, [id number]- Neuromuscular re-education, 97535- Self Care, [id number]- Manual therapy, [id number]- Gait training, [id number]- Orthotic Initial, [id number]- Orthotic/Prosthetic subsequent, [phone]- Canalith repositioning, Patient/Family education, Balance training,  Stair training, Taping, Joint mobilization, Spinal mobilization, Vestibular training, DME instructions, Cryotherapy, and Moist heat  PLAN FOR NEXT SESSION:  repeat some oculomotor testing (gaze induced nystagmus) with vestibular goggles if pt able/not too symptomatic at baseline, horizontal head shake test if pt able, positional testing if time, DGI, BP, manual therapy   Darryle Patten PT, DPT   Darryle JONELLE Patten, PT 04/26/2024, 4:45 PM

## 2024-04-29 ENCOUNTER — Emergency Department

## 2024-04-29 ENCOUNTER — Other Ambulatory Visit: Payer: Self-pay

## 2024-04-29 ENCOUNTER — Emergency Department
Admission: EM | Admit: 2024-04-29 | Discharge: 2024-04-29 | Disposition: A | Discharge status: Home / Self Care | Attending: Emergency Medicine | Admitting: Emergency Medicine

## 2024-04-29 DIAGNOSIS — R1084 Generalized abdominal pain: Secondary | ICD-10-CM | POA: Insufficient documentation

## 2024-04-29 DIAGNOSIS — I10 Essential (primary) hypertension: Secondary | ICD-10-CM | POA: Diagnosis not present

## 2024-04-29 DIAGNOSIS — E119 Type 2 diabetes mellitus without complications: Secondary | ICD-10-CM | POA: Insufficient documentation

## 2024-04-29 DIAGNOSIS — R4182 Altered mental status, unspecified: Secondary | ICD-10-CM | POA: Insufficient documentation

## 2024-04-29 DIAGNOSIS — Z5329 Procedure and treatment not carried out because of patient's decision for other reasons: Secondary | ICD-10-CM | POA: Diagnosis not present

## 2024-04-29 LAB — COMPREHENSIVE METABOLIC PANEL WITH GFR
ALT: 17 U/L (ref 0–44)
AST: 25 U/L (ref 15–41)
Albumin: 4 g/dL (ref 3.5–5.0)
Alkaline Phosphatase: 78 U/L (ref 38–126)
Anion gap: 15 (ref 5–15)
BUN: 23 mg/dL (ref 8–23)
CO2: 18 mmol/L — ABNORMAL LOW (ref 22–32)
Calcium: 9.4 mg/dL (ref 8.9–10.3)
Chloride: 104 mmol/L (ref 98–111)
Creatinine, Ser: 0.85 mg/dL (ref 0.44–1.00)
GFR, Estimated: >60 mL/min (ref >60)
Glucose, Bld: 186 mg/dL — ABNORMAL HIGH (ref 70–99)
Potassium: 3.2 mmol/L — ABNORMAL LOW (ref 3.5–5.1)
Sodium: 137 mmol/L (ref 135–145)
Total Bilirubin: 0.8 mg/dL (ref 0.0–1.2)
Total Protein: 8.2 g/dL — ABNORMAL HIGH (ref 6.5–8.1)

## 2024-04-29 LAB — CBC
HCT: 43.1 % (ref 36.0–46.0)
Hemoglobin: 14.6 g/dL (ref 12.0–15.0)
MCH: 29.5 pg (ref 26.0–34.0)
MCHC: 33.9 g/dL (ref 30.0–36.0)
MCV: 87.1 fL (ref 80.0–100.0)
Platelets: 447 10*3/uL — ABNORMAL HIGH (ref 150–400)
RBC: 4.95 MIL/uL (ref 3.87–5.11)
RDW: 13 % (ref 11.5–15.5)
WBC: 9.3 10*3/uL (ref 4.0–10.5)
nRBC: 0 % (ref 0.0–0.2)

## 2024-04-29 LAB — LIPASE, BLOOD: Lipase: 37 U/L (ref 11–51)

## 2024-04-29 LAB — ETHANOL: Alcohol, Ethyl (B): <15 mg/dL (ref <15)

## 2024-04-29 LAB — TROPONIN I (HIGH SENSITIVITY)
Troponin I (High Sensitivity): 5 ng/L (ref <18)
Troponin I (High Sensitivity): 5 ng/L (ref <18)

## 2024-04-29 LAB — AMMONIA: Ammonia: <13 umol/L (ref 9–35)

## 2024-04-29 MED ORDER — ONDANSETRON HCL 4 MG/2ML IJ SOLN
4.0000 mg | Freq: Once | INTRAMUSCULAR | Status: AC
  Administered 2024-04-29: 4 mg via INTRAVENOUS
  Filled 2024-04-29: qty 2

## 2024-04-29 MED ORDER — SODIUM CHLORIDE 0.9 % IV BOLUS
1000.0000 mL | Freq: Once | INTRAVENOUS | Status: AC
  Administered 2024-04-29: 1000 mL via INTRAVENOUS

## 2024-04-29 MED ORDER — IOHEXOL 300 MG/ML  SOLN
100.0000 mL | Freq: Once | INTRAMUSCULAR | Status: AC | PRN
  Administered 2024-04-29: 100 mL via INTRAVENOUS

## 2024-04-29 NOTE — ED Provider Notes (Signed)
 [facility] Emergency Department Provider Note     Event Date/Time   First MD Initiated Contact with Patient 04/29/24 1144     (approximate)   History   Abdominal Pain   HPI  History is provided by the patient's husband who is at bedside.  This patient is unable to give HPI due to her AMS.  Patient's husband notes that her last known well was approximately 11:00 last night when they went to bed (see separately).   Courtney Foley is a 63 y.o. female with a history of HTN, DM, HLD, obesity, chronic vomiting, and status post gastric bypass, presents to the ED via EMS from home.  Initial reports were for generalized abdominal pain as well as nausea.  Patient also reports she did not take her hypertension medication this morning.  The patient's husband reports coming in from the shed to find her on the toilet. She was alert enough to answer his questions, but he noted several episodes of  'word-searching' on her part. He notes she is normally fluent with her speech and alert x 4. She denies any sick contacts, bad food exposure, or recent travel.  No reports of any fevers, chills, or sweats.  No bladder or bowel changes reported.  Physical Exam   Triage Vital Signs: ED Triage Vitals  Encounter Vitals Group     BP 04/29/24 1117 (!) 138/102     Girls Systolic BP Percentile --      Girls Diastolic BP Percentile --      Boys Systolic BP Percentile --      Boys Diastolic BP Percentile --      Pulse Rate 04/29/24 1117 78     Resp 04/29/24 1117 (!) 22     Temp 04/29/24 1117 98 F (36.7 C)     Temp Source 04/29/24 1117 Oral     SpO2 04/29/24 1117 100 %     Weight --      Height --      Head Circumference --      Peak Flow --      Pain Score 04/29/24 1111 5     Pain Loc --      Pain Education --      Exclude from Growth Chart --     Most recent vital signs: Vitals:   04/29/24 1528 04/29/24 1604  BP:    Pulse:  97  Resp:  (!) 21  Temp: (!) 96.8 F (36  C)   SpO2:  100%    General Awake, no distress. Alert to person only. No slurred speech HEENT NCAT. PERRL. EOMI. No rhinorrhea. Mucous membranes are moist.  CV:  Good peripheral perfusion.  RESP:  Normal effort.  ABD:  No distention. Soft. Mildly tender to palp over the epigastrium. No rebound, guarding, or rigidity.   NEURO: CN II-XII grossly intact.   ED Results / Procedures / Treatments   Labs (all labs ordered are listed, but only abnormal results are displayed) Labs Reviewed  COMPREHENSIVE METABOLIC PANEL WITH GFR - Abnormal; Notable for the following components:      Result Value   Potassium 3.2 (*)    CO2 18 (*)    Glucose, Bld 186 (*)    Total Protein 8.2 (*)    All other components within normal limits  CBC - Abnormal; Notable for the following components:   Platelets 447 (*)    All other components within normal limits  LIPASE, BLOOD  AMMONIA  ETHANOL  URINALYSIS, ROUTINE W REFLEX MICROSCOPIC  CBG MONITORING, ED  TROPONIN I (HIGH SENSITIVITY)  TROPONIN I (HIGH SENSITIVITY)    EKG  Vent. rate 68 BPM PR interval 202 ms QRS duration 112 ms QT/QTcB 456/484 ms P-R-T axes 31 -40 33 Normal sinus rhythm Left axis deviation Moderate voltage criteria for LVH, may be normal variant ( R in aVL , Cornell product ) Abnormal ECG When compared with ECG of 13-Mar-2018 11:52, Vent. rate has decreased BY 38 BPM Borderline criteria for Lateral infarct are no longer Present Nonspecific T wave abnormality now evident in Inferior leads T wave amplitude has decreased in Lateral leads  RADIOLOGY  I personally viewed and evaluated these images as part of my medical decision making, as well as reviewing the written report by the radiologist.  ED Provider Interpretation: No acute findings of the abdominal CT; negative chest x-ray; negative head CT  CT ABDOMEN PELVIS W CONTRAST Result Date: 04/29/2024 CLINICAL DATA:  Acute abdominal pain and nausea beginning this morning.  EXAM: CT ABDOMEN AND PELVIS WITH CONTRAST TECHNIQUE: Multidetector CT imaging of the abdomen and pelvis was performed using the standard protocol following bolus administration of intravenous contrast. RADIATION DOSE REDUCTION: This exam was performed according to the departmental dose-optimization program which includes automated exposure control, adjustment of the mA and/or kV according to patient size and/or use of iterative reconstruction technique. CONTRAST:  100mL OMNIPAQUE IOHEXOL 300 MG/ML  SOLN COMPARISON:  None Available. FINDINGS: Lower Chest: No acute findings. Hepatobiliary: No suspicious hepatic masses identified. Prior cholecystectomy. No evidence of biliary obstruction. Pancreas:  No mass or inflammatory changes. Spleen: Within normal limits in size and appearance. Adrenals/Urinary Tract: 1.2 cm homogeneous right adrenal mass is seen. No suspicious renal masses identified. No evidence of ureteral calculi or hydronephrosis. Unremarkable unopacified urinary bladder. Stomach/Bowel: Prior gastric bypass surgery noted. Small intussusception seen at the entero-enteric anastomosis site in the left abdomen. No evidence of obstruction, inflammatory process or abnormal fluid collections. Normal appendix visualized. Diverticulosis is seen mainly involving the sigmoid colon, however there is no evidence of diverticulitis. Vascular/Lymphatic: No pathologically enlarged lymph nodes. No acute vascular findings. Reproductive: 3.8 x 3.4 cm left adnexal mass is seen which abuts the uterus. This could represent a pedunculated fibroid or left ovarian mass. No signs of inflammatory process or ascites. Other: 2 small adjacent epigastric ventral hernias are seen near the midline, both containing only fat. Musculoskeletal:  No suspicious bone lesions identified. IMPRESSION: Previous gastric bypass surgery. Small bowel intussusception at site of entero-enteric anastomosis in the left abdomen. No evidence of bowel  obstruction. 3.8 cm left adnexal mass, which could represent a pedunculated fibroid or left ovarian mass. Pelvic MRI without and with contrast is suggested for further characterization. Colonic diverticulosis, without radiographic evidence of diverticulitis. Two small adjacent epigastric ventral hernias, both containing only fat. 1.2 cm right adrenal mass, which is indeterminate. 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 If patient does have a history of cancer, recommend further evaluation with adrenal protocol abdomen CT without and with contrast. (This recommendation follows ACR consensus guidelines: Management of Incidental Adrenal Masses: A White Paper of the ACR Incidental Findings Committee. J Am Coll Radiol 2017;14:1038-1044.) Electronically Signed   By: Norleen DELENA Kil M.D.   On: 04/29/2024 16:13   DG Chest 1 View Result Date: 04/29/2024 CLINICAL DATA:  Shortness of breath. EXAM: CHEST  1 VIEW COMPARISON:  Chest  radiograph dated 03/13/2018. FINDINGS: The heart size and mediastinal contours are within normal limits. No focal consolidation, pleural effusion, or pneumothorax. No acute osseous abnormality. IMPRESSION: No acute cardiopulmonary findings. Electronically Signed   By: Harrietta Sherry M.D.   On: 04/29/2024 13:23   CT HEAD WO CONTRAST Result Date: 04/29/2024 CLINICAL DATA:  Provided history: Mental status change, unknown cause. EXAM: CT HEAD WITHOUT CONTRAST TECHNIQUE: Contiguous axial images were obtained from the base of the skull through the vertex without intravenous contrast. RADIATION DOSE REDUCTION: This exam was performed according to the departmental dose-optimization program which includes automated exposure control, adjustment of the mA and/or kV according to patient size and/or use of iterative reconstruction technique. COMPARISON:  None. FINDINGS: Brain: Mild cerebral atrophy.  Commensurate prominence of the ventricles and sulci, which appears commensurate. Patchy and ill-defined hypoattenuation within the cerebral white matter, nonspecific but compatible with mild-to-moderate chronic small vessel ischemic disease. There is no acute intracranial hemorrhage. No demarcated cortical infarct. No extra-axial fluid collection. No evidence of an intracranial mass. No midline shift. Vascular: No hyperdense vessel.  Atherosclerotic calcifications. Skull: No calvarial fracture or aggressive osseous lesion. Sinuses/Orbits: No mass or acute finding within the imaged orbits. No significant paranasal sinus disease. IMPRESSION: 1. No evidence of an acute intracranial abnormality. 2. Parenchymal atrophy and chronic small vessel ischemic disease. Electronically Signed   By: Rockey Childs D.O.   On: 04/29/2024 12:45    PROCEDURES:  Critical Care performed: No  Procedures   MEDICATIONS ORDERED IN ED: Medications  sodium chloride 0.9 % bolus 1,000 mL (0 mLs Intravenous Stopped 04/29/24 1611)  ondansetron (ZOFRAN) injection 4 mg (4 mg Intravenous Given 04/29/24 1448)  iohexol (OMNIPAQUE) 300 MG/ML solution 100 mL (100 mLs Intravenous Contrast Given 04/29/24 1531)     IMPRESSION / MDM / ASSESSMENT AND PLAN / ED COURSE  I reviewed the triage vital signs and the nursing notes.                              Differential diagnosis includes, but is not limited to, alcohol, illicit or prescription medications, or other toxic ingestion; intracranial pathology such as stroke or intracerebral hemorrhage; fever or infectious causes including sepsis; hypoxemia and/or hypercarbia; uremia; trauma; endocrine related disorders such as diabetes, hypoglycemia, and thyroid -related diseases; hypertensive encephalopathy; etc.  Patient's presentation is most consistent with acute presentation with potential threat to life or bodily function.  04/29/2024 at 4:09 PM:  The patient requested to leave.  I  considered this to be leaving against medical advice. I personally discussed the following with them:   That they currently had a medical condition of alerted mental status and I am concerned that they may have dehydration, electrolyte abnormality, alcohol withdrawal, stroke, or infecion.    My proposed course of evaluation and treatment includes, but is not limited to,  labs, IV fluids, CT scans, etc.  Benefits of staying include possible diagnosis or excluding of stroke or an alternative serious condition such as sepsis, which if identified early would lead to appropriate intervention in a timely manner lessening the burden of disability and death.  Risks of leaving before this had been completed include: misdiagnosis, worsening illness leading up to and including prolonged or permanent disability or death.  Despite this they stated they wanted to leave due to displeasure with care and refused further evaluation, treatment, or admission at this time.   They appeared clinically sober, were mentating appropriately, were  free from distracting injury, had adequately controlled acute pain, appeared to have intact insight, judgment, and reason, and in my opinion had the capacity to make this decision.  Specifically, they were able to verbally state back in a coherent manner their current medical condition/current diagnosis, the proposed course of evaluation and/or treatment, and the risks, benefits, and alternatives of treatment versus leaving against medical advice.   They understand that they may return to seek medical attention here at ANY time they want.  I strongly advised them to return to the Emergency Department immediately if they experience any new or worsening symptoms that concern them, or simply if they reconsider continued evaluation and/or treatment as previously discussed.  This would be without any repercussions, though they understand they likely will need to wait again in the Emergency  Department if other patients are in front of them, rather than being brought straight back.  They understood this is another advantage of staying, but still insisted upon leaving.  I recommended they follow-up with this or another emergency department at the earliest available opportunity/appointment for further evaluation and treatment.   The patient was discharged against medical advice.  They did accept written discharge instructions.  Patient's diagnosis is consistent with altered mental status on presentation.  Patient presents with her husband unable to give HPI personally.  He reports that the patient was last known well 11 PM last night.  She is also endorsing some vague generalized abdominal discomfort.  She is afebrile on presentation with no reports of any recent injury or trauma.  Labs are reassuring without evidence of leukocyturia, critical anemia, or electrolyte abnormality.  Vital signs are stable without evidence of tachycardia, tachypnea, or fever.  Imaging including CT head negative for any acute intracranial process.  Chest x-ray interpreted by me, shows no intrathoracic process.  CT abdomen pelvis with contrast, was not available at the time patient decided to leave the ED AMA.  She was able to dress herself as she was at that time alert, oriented, and able to answer questions appropriately.  She verbalized her desire to be followed up and evaluated at [facility].  She also verbalized that she understood her workup was incomplete at this time, noting pending labs and CT imaging. Patient is given ED precautions to return to the ED for any worsening or new symptoms.  Clinical Course as of 04/29/24 1704  Sun Apr 29, 2024  1348 DG Chest 1 View [JM]    Clinical Course User Index [JM] Loyd Candida LULLA Kings, [place]    FINAL CLINICAL IMPRESSION(S) / ED DIAGNOSES   Final diagnoses:  Altered mental status, unspecified altered mental status type  Generalized abdominal pain     Rx  / DC Orders   ED Discharge Orders     None        Note:  This document was prepared using Dragon voice recognition software and may include unintentional dictation errors.    Loyd Candida LULLA Kings, PA-C 04/29/24 1716    Waymond Lorelle Cummins, MD 04/29/24 1739

## 2024-04-29 NOTE — ED Notes (Signed)
 Attempted iv x 2. Not successful. Asked other ER staff to attempt.  IV team order in

## 2024-04-29 NOTE — Discharge Instructions (Signed)
 04/29/2024 at 4:09 PM:  The patient requested to leave.  I considered this to be leaving against medical advice. I personally discussed the following with them:   That they currently had a medical condition of alerted mental status and I am concerned that they may have dehydration, electrolyte abnormality, alcohol withdrawal, stroke, or infecion.    My proposed course of evaluation and treatment includes, but is not limited to,  labs, IV fluids, CT scans, etc.  Benefits of staying include possible diagnosis or excluding of stroke or an alternative serious condition such as sepsis, which if identified early would lead to appropriate intervention in a timely manner lessening the burden of disability and death.  Risks of leaving before this had been completed include: misdiagnosis, worsening illness leading up to and including prolonged or permanent disability or death.  Despite this they stated they wanted to leave due to displeasure with care and refused further evaluation, treatment, or admission at this time.   They appeared clinically sober, were mentating appropriately, were free from distracting injury, had adequately controlled acute pain, appeared to have intact insight, judgment, and reason, and in my opinion had the capacity to make this decision.  Specifically, they were able to verbally state back in a coherent manner their current medical condition/current diagnosis, the proposed course of evaluation and/or treatment, and the risks, benefits, and alternatives of treatment versus leaving against medical advice.   They understand that they may return to seek medical attention here at ANY time they want.  I strongly advised them to return to the Emergency Department immediately if they experience any new or worsening symptoms that concern them, or simply if they reconsider continued evaluation and/or treatment as previously discussed.  This would be without any repercussions, though they  understand they likely will need to wait again in the Emergency Department if other patients are in front of them, rather than being brought straight back.  They understood this is another advantage of staying, but still insisted upon leaving.  I recommended they follow-up with this or another emergency department at the earliest available opportunity/appointment for further evaluation and treatment.   The patient was discharged against medical advice.  They did accept written discharge instructions.

## 2024-04-29 NOTE — ED Notes (Signed)
 Pt now needs to have BM. Family member helping pt in triage bathroom.

## 2024-04-29 NOTE — ED Notes (Signed)
 Pt cleaned up by this nt and Les. Dry brief applied and pt requested to be removed of hospital gown and into regular clothes

## 2024-04-29 NOTE — ED Notes (Signed)
 Pt is c/o of her IV site being painful.

## 2024-04-29 NOTE — ED Triage Notes (Signed)
 To ED from home [facility] for abdominal pain and nausea since this morning. Unable to describe the pain or pinpoint where the pain is. Also EMS placed Simla on pt with no oxygen flowing and pt said it helped the pain. LBM today, normal  EMS VS: 180/103, CBG 215, HR 90, 99% RA, EKG normal. Lung sounds clear  Did not take HTN med this AM   Pt telling this RN in triage that pain is not to abdomen, just keeps saying I don't know, I don't know. Pt is spitting up clear spit into emesis bag and moaning. Then said pain is in my stomach. Pt denies dysuria.  Pt is poor historian. Unable to review PMH or get description of pain. Chart says hx gastric bypass, DM and HTN.

## 2024-05-01 ENCOUNTER — Ambulatory Visit: Attending: Family Medicine

## 2024-05-01 DIAGNOSIS — R2681 Unsteadiness on feet: Secondary | ICD-10-CM | POA: Diagnosis present

## 2024-05-01 DIAGNOSIS — M542 Cervicalgia: Secondary | ICD-10-CM | POA: Diagnosis present

## 2024-05-01 DIAGNOSIS — R42 Dizziness and giddiness: Secondary | ICD-10-CM | POA: Insufficient documentation

## 2024-05-01 DIAGNOSIS — R262 Difficulty in walking, not elsewhere classified: Secondary | ICD-10-CM | POA: Diagnosis present

## 2024-05-01 DIAGNOSIS — M6281 Muscle weakness (generalized): Secondary | ICD-10-CM | POA: Diagnosis present

## 2024-05-01 NOTE — Therapy (Signed)
 OUTPATIENT PHYSICAL THERAPY VESTIBULAR TREATMENT     Patient Name: Courtney Foley MRN: [id number] DOB:1961-02-22, 63 y.o., female Today's Date: 05/01/2024  END OF SESSION:  PT End of Session - 05/01/24 1145     Visit Number 3    Number of Visits 25    Date for PT Re-Evaluation 07/12/24    PT Start Time 1147    PT Stop Time 1230    PT Time Calculation (min) 43 min    Equipment Utilized During Treatment Gait belt    Activity Tolerance Patient tolerated treatment well;Patient limited by pain    Behavior During Therapy WFL for tasks assessed/performed           Past Medical History:  Diagnosis Date   Diabetes mellitus without complication (HCC)    Hypertension    Past Surgical History:  Procedure Laterality Date   CHOLECYSTECTOMY     GASTRIC BYPASS     Patient Active Problem List   Diagnosis Date Noted   Chronic vomiting 06/11/2022   Hyperlipidemia associated with type 2 diabetes mellitus (HCC) 01/04/2022   Routine health maintenance 09/27/2021   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   Diaphoresis 05/13/2014   Dizziness 05/13/2014   Palpitations 05/13/2014   Diabetes mellitus (HCC) 03/04/2014   Hypertension associated with diabetes (HCC) 05/11/2013   Low back pain 05/11/2013   Obesity 05/11/2013   S/P gastric bypass 05/11/2013    PCP: Leopold Belvie RAMAN, MD REFERRING PROVIDER: Devra Channel, MD  REFERRING DIAG:  Diagnosis  R42 (ICD-10-CM) - Vertigo    THERAPY DIAG:  Unsteadiness on feet  Muscle weakness (generalized)  Dizziness and giddiness  ONSET DATE: 2023  Rationale for Evaluation and Treatment: Rehabilitation  SUBJECTIVE:   SUBJECTIVE STATEMENT: Pt reports she was seen in ED 6/29 for abdominal pain, word-finding issues, confusion/difficulty answering questions, reports left ED early due to displeasure with care.  No diagnosis at this time. Per ED note pt with no significant findings on chest radiograph or CT HEAD (please refer to ED note in chart  for full details, abdominal imging). Pt reports she is feeling ok today, and she has a follow-up this Thursday with her PCP to go over symptoms. She is able to ambulate today without her cane.   Pt accompanied by: spouse  PERTINENT HISTORY:   From Eval:  The pt is a pleasant 63 y/o female presenting to PT for dizziness and imbalance. Pt reports symptom onset two years ago. Pt says nothing conclusive was found at recent ENT visit (she describes being tested for BPPV deficit/positional maneuvers). She reports nothing found on imaging during hospitalization (5/7) where pt had presented to ED with positive HITNS exam (per chart). Pt reports CRM was provided by ENT and did make symptoms a little better. Pt says over past two years she's developed a few different symptoms: two years ago she started experiencing lots of pain, which turned into weakness and difficulty walking. She had some difficulty moving R shoulder as well (has had injections for chronic pain multiple sites). In May this year she reports her eyesight got screwed up, where she was seeing double and she had vertigo. Pt states, lights on the ceiling were down on the floor, and everything made her feel nauseated to the point she had to shut her eyes. She was given Meclizine at the time to tx symptoms and this helped some. Her vision has gradually improved since. Pt reports she does experience some headaches...about once a month and they don't last long. Pt reports  hearing loss but has not had hearing tested. Her spouse is present for exam and reports he has noticed pt cannot hear him as well.. Regarding dizziness, pt reports she doesn't really experience spinning sensation, but doesn't like to be moved quickly as this can increase her symptoms. Pt reports she does experience light sensitivity during episodes and that she can become so nauseated she vomits. She has poor balance, particularly in the dark. She cannot close her eyes in the shower  due to LOB. Other symptoms: pt reports bilateral tinnitus, increased ear wax with recent L ear wax removal that might have helped symptoms.   Per chart PMH significant for DMII (on insulin), HTN, hx gastric bypass, chronic pain per pt report  PAIN:  Are you having pain? Pt does have neck pain, more on the R side than L side. She describes it has sharp. Pt reports pain comes and goes, is unsure how long she has had that for  PRECAUTIONS: Sternal  RED FLAGS: Pt reports changes in ability to swallow water and pills over past year; she also reports urinary accidents >1 year but not as bad as before, states she has discussed with physicians    WEIGHT BEARING RESTRICTIONS: No  FALLS: Has patient fallen in last 6 months? No but she reports it's because she's been more cautious, moves sloly,, prior to that she was falling multiple times/week   LIVING ENVIRONMENT: Lives with: spouse, pets  Stairs: 2-3 with handrail on R  Has following equipment at home: Single point cane and Walker - 2 wheeled, grab bars in the shower and next to toilet, has a shower chair, has toilet riser  PLOF: Independent  PATIENT GOALS: to be able to walk better without constantly feeling she is going to fall, be able to balance in darkness, states she wants to be normal  OBJECTIVE:  Note: Objective measures were completed at Evaluation unless otherwise noted.  DIAGNOSTIC FINDINGS:   MRI BRAIN 03/07/24:  FINDINGS:   There are scattered and confluent foci of signal abnormality within the periventricular and deep white matter.  These are nonspecific but commonly seen with small vessel ischemic changes. Mild global parenchymal volume loss with ex vacuo ventricular dilation. Prominent perivascular spaces in the inferior basal ganglia bilaterally. There is no midline shift. No extra-axial fluid collection. No evidence of intracranial hemorrhage. No diffusion weighted signal abnormality to suggest acute infarct. No  mass.  IMPRESSION: No acute intracranial abnormality. Chronic small vessel ischemic changes. Exam End: 03/07/24 17:00   Specimen Collected: 03/07/24 17:08 Last Resulted: 03/07/24 17:36  Received From: [facility]  Result Received: 04/17/24 13:14    ECG 03/07/24 Antonetta Okey Pac, MD - 03/08/2024 Formatting of this note might be different from the original. SINUS RHYTHM WITH PREMATURE ATRIAL BEATS LEFT ANTERIOR FASCICULAR BLOCK MODERATE VOLTAGE CRITERIA FOR LVH, MAY BE NORMAL VARIANT ( R in aVL , [employer] product ) PROLONGED QT ABNORMAL ECG WHEN COMPARED WITH ECG OF 28-Nov-2023 21:38, PREMATURE ATRIAL BEATS ARE NOW PRESENT BORDERLINE CRITERIA FOR ANTERIOR INFARCT  ARE NO LONGER PRESENT BORDERLINE CRITERIA FOR ANTEROLATERAL INFARCT  ARE NO LONGER PRESENT Confirmed by Antonetta Okey ([id number]) on 03/08/2024 11:35:52 AM Specimen Collected: 03/07/24 14:49   Performed by: [facility] RAD Last Resulted: 03/08/24 11:35  Received From: [facility]  Result Received: 04/17/24 13:14    COGNITION: Overall cognitive status: Within functional limits for tasks assessed     POSTURE:  Rounded shoulders, increased thoracic kyphosis   Cervical ROM:   Deferred  STRENGTH:   LOWER EXTREMITY MMT: deferred  MMT Right eval Left eval  Hip flexion    Hip abduction    Hip adduction    Hip internal rotation    Hip external rotation    Knee flexion    Knee extension    Ankle dorsiflexion    Ankle plantarflexion    Ankle inversion    Ankle eversion    (Blank rows = not tested)  BED MOBILITY:  Needs to be formally assessed, likely with difficulty based on pt subjective hx intake  TRANSFERS: Assistive device utilized: None  Sit to stand: Modified independence increased BUE use, increased time Stand to sit: Modified independence Chair to chair: Modified independence   GAIT: Gait pattern: Pt with low back/L side hip pain, appears to offload LLE throughout gait, but overall increased  lateral sway bilat throughout, decreased step-length, reaches out at times to touch wall to steady self  Distance walked: clinic distances Assistive device utilized: None *Reports on higher symptom days uses SPC or RW for gait   FUNCTIONAL TESTS: deferred to future visit/due to time 5 times sit to stand:   10 meter walk test:   Dynamic Gait Index:    PATIENT SURVEYS:  DHI 82 from 04/26/2024  VESTIBULAR ASSESSMENT:   OCULOMOTOR EXAM:  Ocular Alignment: normal  Ocular ROM: No Limitations but testing does make pt slightly dizzy   Spontaneous Nystagmus: absent - does have end gaze nsytagmus   Gaze-Induced Nystagmus: appears present primary and secondary gaze bilat. Pt will need reassessment with vestibular goggles donned  Smooth Pursuits: overall smooth, some saccades   Saccades: intact  Convergence/Divergence: converges, sees double about 6 away    VESTIBULAR - OCULAR REFLEX:  04/26/2024  Slow VOR: some occ saccadic movement   VOR Cancellation: WNL   Head-Impulse Test: not completed due to neck pain    POSITIONAL TESTING: deferred    OTHOSTATICS: deferred                                                               TREATMENT DATE: 05/01/24  TA:  Pt seated, resting manual BP assessment, LUE: 145/75 mmHg (some difficulty taking manual BP due to noise level of clinic) Reassessed with digital cuff, LUE: 154/88 mmHg HR 87  Physical performance.  [facility] PT Assessment - 05/01/24 0001       Standardized Balance Assessment   Standardized Balance Assessment Berg Balance Test;Dynamic Gait Index      Dynamic Gait Index   Level Surface Normal    Change in Gait Speed Normal    Gait with Horizontal Head Turns Mild Impairment    Gait with Vertical Head Turns Normal    Gait and Pivot Turn Mild Impairment    Step Over Obstacle Mild Impairment    Step Around Obstacles Normal    Steps Mild Impairment    Total Score 20         PT instructed pt in DGI. See below for results.  Demonstrates increased fall risk with score of 20/24. (<19 indicates increased fall risk)    NMR: At support surface- SLB 30 sec each LE with BUE support - unable to complete second set due to LE pain/discomfort --attempted other SLB modifications with various levels of LE support, however, still pain-limited  and discontinued at this time  TA: Seated march 2x10 each LE Seated hip abduction with red t.band 2x10 bilat LE (use of latex-free band since smell of latex band increase pt's nausea)  Nustep for conditioning, LE and cardioresp mm endurance -  lvl 1-2 x 5 min total, SPM 40s-70s. No pain with intervention    PATIENT EDUCATION: Education details: further assessment, exercise technique  Person educated: Patient Education method: Explanation Education comprehension: verbalized understanding  HOME EXERCISE PROGRAM:    GOALS: Goals reviewed with patient? Yes initiated, will continue to review as further testing completed  SHORT TERM GOALS: Target date: 06/12/2024   Patient will be independent in home exercise program to improve dizziness and balance/mobility for better functional independence with ADLs. Baseline: Goal status: INITIAL   LONG TERM GOALS: Target date: 07/24/2024     Patient will reduce dizziness handicap inventory score to <50, for less dizziness with ADLs and increased safety with home and work tasks.  Baseline: 82 04/26/24 Goal status: INITIAL  2.  Patient will report at least a 30% reduction in positional or movement-induced dizziness symptoms to indicate increased ease with ADLs and QOL Baseline: pt symptomatic  Goal status: INITIAL  3.  Patient will increase dynamic gait index score to >19/24 as to demonstrate reduced fall risk and improved dynamic gait balance for better safety with community/home ambulation.   Baseline: 20/24 Goal status: INITIAL  4.  Patient will increase ABC scale score >80% to demonstrate better functional mobility and better  confidence with ADLs.  Baseline: to be assessed Goal status: INITIAL  5. The pt will demonstrate independence with a home symptom management program to assist in decreasing or preventing further increase dizziness symptoms during a flare-up.  Baseline: no program currently in place   Goal status: INITIAL    ASSESSMENT:  CLINICAL IMPRESSION: Pt returns to PT following ED visit due to abdominal pain, confusion, word finding issues; pt now feeling ok and has upcoming follow-up with her PCP. Vitals assessed in session and currently unremarkable. Pt tolerated majority of interventions well, with only slight limitations with completing SLB due to LE discomfort/pain (chronic), no other symptoms. She scored 20/22 on the DGI, where she did exhibit difficulty with gait with horizontal had turns.The pt will benefit from further skilled PT to improve dizziness, balance, strength, gait and mobility to increase ease and safety with ADLS, QOL and decrease fall risk.  OBJECTIVE IMPAIRMENTS: Abnormal gait, decreased activity tolerance, decreased balance, decreased mobility, difficulty walking, decreased strength, dizziness, improper body mechanics, postural dysfunction, and pain.   ACTIVITY LIMITATIONS: standing, squatting, stairs, transfers, bed mobility, bathing, and locomotion level  PARTICIPATION LIMITATIONS: meal prep, cleaning, laundry, shopping, community activity, occupation, yard work, and pt reports general reduction in mobility and ability to complete ADLs due to symptoms  PERSONAL FACTORS: Age, Fitness, Sex, Time since onset of injury/illness/exacerbation, and 3+ comorbidities:  DMII (on insulin), HTN, chronic pain per pt report are also affecting patient's functional outcome.   REHAB POTENTIAL: Good  CLINICAL DECISION MAKING: Unstable/unpredictable  EVALUATION COMPLEXITY: High   PLAN:  PT FREQUENCY: 1-2x/week  PT DURATION: 12 weeks  PLANNED INTERVENTIONS: [id number]- PT Re-evaluation,  [id number]- Physical Performance Testing, 97110-Therapeutic exercises, 97530- Therapeutic activity, [id number]- Neuromuscular re-education, 97535- Self Care, [id number]- Manual therapy, [id number]- Gait training, [phone]- Orthotic Initial, [phone]- Orthotic/Prosthetic subsequent, [phone]- Canalith repositioning, Patient/Family education, Balance training, Stair training, Taping, Joint mobilization, Spinal mobilization, Vestibular training, DME instructions, Cryotherapy, and Moist heat  PLAN FOR NEXT SESSION:  repeat some oculomotor  testing (gaze induced nystagmus) with vestibular goggles if pt able/not too symptomatic at baseline, horizontal head shake test if pt able, positional testing if time, DGI, BP, manual therapy   Darryle Patten PT, DPT   Darryle JONELLE Patten, PT 05/01/2024, 3:48 PM

## 2024-05-03 ENCOUNTER — Ambulatory Visit

## 2024-05-03 NOTE — Therapy (Incomplete)
 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  ONSET DATE: 2023  Rationale for Evaluation and Treatment: Rehabilitation  SUBJECTIVE:   SUBJECTIVE STATEMENT: Pt reports she was seen in ED 6/29 for abdominal pain, word-finding issues, confusion/difficulty answering questions, reports left ED early due to displeasure with care.  No diagnosis at this time. Per ED note pt with no significant findings on chest radiograph or CT HEAD (please refer to ED note in chart for full details, abdominal imging). Pt reports she is feeling ok today, and she has a follow-up this Thursday with her PCP to go over symptoms. She is able to ambulate today without her cane.   Pt accompanied by: spouse  PERTINENT HISTORY:   From Eval:  The pt is a pleasant 63 y/o female presenting to PT for dizziness and imbalance. Pt reports symptom onset two years ago. Pt says nothing conclusive was found at recent ENT visit (she describes being  tested for BPPV deficit/positional maneuvers). She reports nothing found on imaging during hospitalization (5/7) where pt had presented to ED with positive HITNS exam (per chart). Pt reports CRM was provided by ENT and did make symptoms a little better. Pt says over past two years she's developed a few different symptoms: two years ago she started experiencing lots of pain, which turned into weakness and difficulty walking. She had some difficulty moving R shoulder as well (has had injections for chronic pain multiple sites). In May this year she reports her eyesight got screwed up, where she was seeing double and she had vertigo. Pt states, lights on the ceiling were down on the floor, and everything made her feel nauseated to the point she had to shut her eyes. She was given Meclizine at the time to tx symptoms and this helped some. Her vision has gradually improved since. Pt reports she does experience some headaches...about once a month and they don't last long. Pt reports hearing loss but has not had hearing tested. Her spouse is present for exam and reports he has noticed pt cannot hear him as well.. Regarding dizziness, pt reports she doesn't really experience spinning sensation, but doesn't like to be moved quickly as this can increase her symptoms. Pt reports she does experience light sensitivity during episodes and that she can become so nauseated she vomits. She has poor balance, particularly in the dark. She cannot close her eyes in the shower due to LOB. Other symptoms: pt reports bilateral tinnitus, increased ear wax with recent L ear  wax removal that might have helped symptoms.   Per chart PMH significant for DMII (on insulin), HTN, hx gastric bypass, chronic pain per pt report  PAIN:  Are you having pain? Pt does have neck pain, more on the R side than L side. She describes it has sharp. Pt reports pain comes and goes, is unsure how long she has had that for  PRECAUTIONS: Sternal  RED  FLAGS: Pt reports changes in ability to swallow water and pills over past year; she also reports urinary accidents >1 year but not as bad as before, states she has discussed with physicians    WEIGHT BEARING RESTRICTIONS: No  FALLS: Has patient fallen in last 6 months? No but she reports it's because she's been more cautious, moves sloly,, prior to that she was falling multiple times/week   LIVING ENVIRONMENT: Lives with: spouse, pets  Stairs: 2-3 with handrail on R  Has following equipment at home: Single point cane and Walker - 2 wheeled, grab bars in the shower and next to toilet, has a shower chair, has toilet riser  PLOF: Independent  PATIENT GOALS: to be able to walk better without constantly feeling she is going to fall, be able to balance in darkness, states she wants to be normal  OBJECTIVE:  Note: Objective measures were completed at Evaluation unless otherwise noted.  DIAGNOSTIC FINDINGS:   MRI BRAIN 03/07/24:  FINDINGS:   There are scattered and confluent foci of signal abnormality within the periventricular and deep white matter.  These are nonspecific but commonly seen with small vessel ischemic changes. Mild global parenchymal volume loss with ex vacuo ventricular dilation. Prominent perivascular spaces in the inferior basal ganglia bilaterally. There is no midline shift. No extra-axial fluid collection. No evidence of intracranial hemorrhage. No diffusion weighted signal abnormality to suggest acute infarct. No mass.  IMPRESSION: No acute intracranial abnormality. Chronic small vessel ischemic changes. Exam End: 03/07/24 17:00   Specimen Collected: 03/07/24 17:08 Last Resulted: 03/07/24 17:36  Received From: [facility]  Result Received: 04/17/24 13:14    ECG 03/07/24 Antonetta Okey Pac, MD - 03/08/2024 Formatting of this note might be different from the original. SINUS RHYTHM WITH PREMATURE ATRIAL BEATS LEFT ANTERIOR FASCICULAR BLOCK MODERATE VOLTAGE  CRITERIA FOR LVH, MAY BE NORMAL VARIANT ( R in aVL , [employer] product ) PROLONGED QT ABNORMAL ECG WHEN COMPARED WITH ECG OF 28-Nov-2023 21:38, PREMATURE ATRIAL BEATS ARE NOW PRESENT BORDERLINE CRITERIA FOR ANTERIOR INFARCT  ARE NO LONGER PRESENT BORDERLINE CRITERIA FOR ANTEROLATERAL INFARCT  ARE NO LONGER PRESENT Confirmed by Antonetta Okey ([id number]) on 03/08/2024 11:35:52 AM Specimen Collected: 03/07/24 14:49   Performed by: [facility] RAD Last Resulted: 03/08/24 11:35  Received From: [facility]  Result Received: 04/17/24 13:14    COGNITION: Overall cognitive status: Within functional limits for tasks assessed     POSTURE:  Rounded shoulders, increased thoracic kyphosis   Cervical ROM:   Deferred  STRENGTH:   LOWER EXTREMITY MMT: deferred  MMT Right eval Left eval  Hip flexion    Hip abduction    Hip adduction    Hip internal rotation    Hip external rotation    Knee flexion    Knee extension    Ankle dorsiflexion    Ankle plantarflexion    Ankle inversion    Ankle eversion    (Blank rows = not tested)  BED MOBILITY:  Needs to be formally assessed, likely with difficulty based on pt subjective hx intake  TRANSFERS: Assistive  device utilized: None  Sit to stand: Modified independence increased BUE use, increased time Stand to sit: Modified independence Chair to chair: Modified independence   GAIT: Gait pattern: Pt with low back/L side hip pain, appears to offload LLE throughout gait, but overall increased lateral sway bilat throughout, decreased step-length, reaches out at times to touch wall to steady self  Distance walked: clinic distances Assistive device utilized: None *Reports on higher symptom days uses SPC or RW for gait   FUNCTIONAL TESTS: deferred to future visit/due to time 5 times sit to stand:   10 meter walk test:   Dynamic Gait Index:    PATIENT SURVEYS:  DHI 82 from 04/26/2024  VESTIBULAR ASSESSMENT:   OCULOMOTOR EXAM:  Ocular Alignment:  normal  Ocular ROM: No Limitations but testing does make pt slightly dizzy   Spontaneous Nystagmus: absent - does have end gaze nsytagmus   Gaze-Induced Nystagmus: appears present primary and secondary gaze bilat. Pt will need reassessment with vestibular goggles donned  Smooth Pursuits: overall smooth, some saccades   Saccades: intact  Convergence/Divergence: converges, sees double about 6 away    VESTIBULAR - OCULAR REFLEX:  04/26/2024  Slow VOR: some occ saccadic movement   VOR Cancellation: WNL   Head-Impulse Test: not completed due to neck pain    POSITIONAL TESTING: deferred    OTHOSTATICS: deferred                                                               TREATMENT DATE: 05/03/24  Add in cervical stretching as tolerated, manual Review HEP: seated march, hip abd, nustep Discuss dec frequency to 1x/week due to visit limits  Complete SIDELYING test instead of Dix-Hallpike due to cervical pain Roll test  *add STS or mini squats to HEP Access Code: [email or web address] URL: [email or web address] Date: 04/26/2024 Prepared by: Darryle Patten   Exercises - Seated March  - 1 x daily - 5-6 x weekly - 3 sets - 10 reps - Seated Hip Abduction with Resistance  - 1 x daily - 5-6 x weekly - 3 sets - 10 reps - Walking  - 1 x daily - 7 x weekly - 3 sets - 1 reps - 2 minutes  hold  PREVIOUS TA:  Pt seated, resting manual BP assessment, LUE: 145/75 mmHg (some difficulty taking manual BP due to noise level of clinic) Reassessed with digital cuff, LUE: 154/88 mmHg HR 87  Physical performance.   PT instructed pt in DGI. See below for results. Demonstrates increased fall risk with score of 20/24. (<19 indicates increased fall risk)    NMR: At support surface- SLB 30 sec each LE with BUE support - unable to complete second set due to LE pain/discomfort --attempted other SLB modifications with various levels of LE support, however, still pain-limited and discontinued at this  time  TA: Seated march 2x10 each LE Seated hip abduction with red t.band 2x10 bilat LE (use of latex-free band since smell of latex band increase pt's nausea)  Nustep for conditioning, LE and cardioresp mm endurance -  lvl 1-2 x 5 min total, SPM 40s-70s. No pain with intervention    PATIENT EDUCATION: Education details: further assessment, exercise technique  Person educated: Patient Education method: Explanation Education comprehension: verbalized understanding  HOME EXERCISE PROGRAM:  GOALS: Goals reviewed with patient? Yes initiated, will continue to review as further testing completed  SHORT TERM GOALS: Target date: 06/14/2024   Patient will be independent in home exercise program to improve dizziness and balance/mobility for better functional independence with ADLs. Baseline: Goal status: INITIAL   LONG TERM GOALS: Target date: 07/26/2024     Patient will reduce dizziness handicap inventory score to <50, for less dizziness with ADLs and increased safety with home and work tasks.  Baseline: 82 04/26/24 Goal status: INITIAL  2.  Patient will report at least a 30% reduction in positional or movement-induced dizziness symptoms to indicate increased ease with ADLs and QOL Baseline: pt symptomatic  Goal status: INITIAL  3.  Patient will increase dynamic gait index score to >19/24 as to demonstrate reduced fall risk and improved dynamic gait balance for better safety with community/home ambulation.   Baseline: 20/24 Goal status: INITIAL  4.  Patient will increase ABC scale score >80% to demonstrate better functional mobility and better confidence with ADLs.  Baseline: to be assessed Goal status: INITIAL  5. The pt will demonstrate independence with a home symptom management program to assist in decreasing or preventing further increase dizziness symptoms during a flare-up.  Baseline: no program currently in place   Goal status: INITIAL    ASSESSMENT:  CLINICAL  IMPRESSION: Pt returns to PT following ED visit due to abdominal pain, confusion, word finding issues; pt now feeling ok and has upcoming follow-up with her PCP. Vitals assessed in session and currently unremarkable. Pt tolerated majority of interventions well, with only slight limitations with completing SLB due to LE discomfort/pain (chronic), no other symptoms. She scored 20/22 on the DGI, where she did exhibit difficulty with gait with horizontal had turns.The pt will benefit from further skilled PT to improve dizziness, balance, strength, gait and mobility to increase ease and safety with ADLS, QOL and decrease fall risk.  OBJECTIVE IMPAIRMENTS: Abnormal gait, decreased activity tolerance, decreased balance, decreased mobility, difficulty walking, decreased strength, dizziness, improper body mechanics, postural dysfunction, and pain.   ACTIVITY LIMITATIONS: standing, squatting, stairs, transfers, bed mobility, bathing, and locomotion level  PARTICIPATION LIMITATIONS: meal prep, cleaning, laundry, shopping, community activity, occupation, yard work, and pt reports general reduction in mobility and ability to complete ADLs due to symptoms  PERSONAL FACTORS: Age, Fitness, Sex, Time since onset of injury/illness/exacerbation, and 3+ comorbidities:  DMII (on insulin), HTN, chronic pain per pt report are also affecting patient's functional outcome.   REHAB POTENTIAL: Good  CLINICAL DECISION MAKING: Unstable/unpredictable  EVALUATION COMPLEXITY: High   PLAN:  PT FREQUENCY: 1-2x/week  PT DURATION: 12 weeks  PLANNED INTERVENTIONS: [id number]- PT Re-evaluation, 97750- Physical Performance Testing, 97110-Therapeutic exercises, 97530- Therapeutic activity, [id number]- Neuromuscular re-education, 97535- Self Care, [id number]- Manual therapy, [id number]- Gait training, [phone]- Orthotic Initial, [phone]- Orthotic/Prosthetic subsequent, [phone]- Canalith repositioning, Patient/Family education, Balance training, Stair training,  Taping, Joint mobilization, Spinal mobilization, Vestibular training, DME instructions, Cryotherapy, and Moist heat  PLAN FOR NEXT SESSION:  repeat some oculomotor testing (gaze induced nystagmus) with vestibular goggles if pt able/not too symptomatic at baseline, horizontal head shake test if pt able, positional testing if time, DGI, BP, manual therapy   Darryle Patten PT, DPT   Darryle JONELLE Patten, PT 05/03/2024, 8:29 AM

## 2024-05-06 ENCOUNTER — Other Ambulatory Visit: Payer: Self-pay | Admitting: Physician Assistant

## 2024-05-06 DIAGNOSIS — R1084 Generalized abdominal pain: Secondary | ICD-10-CM

## 2024-05-07 ENCOUNTER — Ambulatory Visit

## 2024-05-07 ENCOUNTER — Telehealth: Payer: Self-pay

## 2024-05-07 NOTE — Telephone Encounter (Signed)
 PT contacted pt via secure line due to missed visit/no-show this morning. Pt reported she did not realize she had an appointment this morning due to typically coming in Tues & Thursdays. PT provided next appointment date/time info. Pt confirms Thursday appointment.  Darryle Patten PT, DPT

## 2024-05-10 ENCOUNTER — Ambulatory Visit

## 2024-05-10 DIAGNOSIS — R2681 Unsteadiness on feet: Secondary | ICD-10-CM

## 2024-05-10 DIAGNOSIS — M542 Cervicalgia: Secondary | ICD-10-CM

## 2024-05-10 DIAGNOSIS — R262 Difficulty in walking, not elsewhere classified: Secondary | ICD-10-CM

## 2024-05-10 DIAGNOSIS — M6281 Muscle weakness (generalized): Secondary | ICD-10-CM

## 2024-05-10 DIAGNOSIS — R42 Dizziness and giddiness: Secondary | ICD-10-CM

## 2024-05-10 NOTE — Therapy (Signed)
 OUTPATIENT PHYSICAL THERAPY VESTIBULAR TREATMENT     Patient Name: Courtney Foley MRN: [id number] DOB:1961-08-03, 63 y.o., female Today's Date: 05/10/2024  END OF SESSION:  PT End of Session - 05/10/24 1153     Visit Number 4    Number of Visits 25    Date for PT Re-Evaluation 07/12/24    PT Start Time 1154    PT Stop Time 1230    PT Time Calculation (min) 36 min    Equipment Utilized During Treatment Gait belt    Activity Tolerance Patient tolerated treatment well;Patient limited by pain    Behavior During Therapy WFL for tasks assessed/performed            Past Medical History:  Diagnosis Date   Diabetes mellitus without complication (HCC)    Hypertension    Past Surgical History:  Procedure Laterality Date   CHOLECYSTECTOMY     GASTRIC BYPASS     Patient Active Problem List   Diagnosis Date Noted   Chronic vomiting 06/11/2022   Hyperlipidemia associated with type 2 diabetes mellitus (HCC) 01/04/2022   Routine health maintenance 09/27/2021   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   Diaphoresis 05/13/2014   Dizziness 05/13/2014   Palpitations 05/13/2014   Diabetes mellitus (HCC) 03/04/2014   Hypertension associated with diabetes (HCC) 05/11/2013   Low back pain 05/11/2013   Obesity 05/11/2013   S/P gastric bypass 05/11/2013    PCP: Leopold Belvie RAMAN, MD REFERRING PROVIDER: Devra Channel, MD  REFERRING DIAG:  Diagnosis  R42 (ICD-10-CM) - Vertigo    THERAPY DIAG:  Muscle weakness (generalized)  Difficulty in walking, not elsewhere classified  Unsteadiness on feet  Dizziness and giddiness  Cervicalgia  ONSET DATE: 2023  Rationale for Evaluation and Treatment: Rehabilitation  SUBJECTIVE:   SUBJECTIVE STATEMENT: Pt reports she has had some good symptom days. She was able to walk around outside, vacuum.  Pt accompanied by: spouse  PERTINENT HISTORY:   From Eval:  The pt is a pleasant 63 y/o female presenting to PT for dizziness and  imbalance. Pt reports symptom onset two years ago. Pt says nothing conclusive was found at recent ENT visit (she describes being tested for BPPV deficit/positional maneuvers). She reports nothing found on imaging during hospitalization (5/7) where pt had presented to ED with positive HITNS exam (per chart). Pt reports CRM was provided by ENT and did make symptoms a little better. Pt says over past two years she's developed a few different symptoms: two years ago she started experiencing lots of pain, which turned into weakness and difficulty walking. She had some difficulty moving R shoulder as well (has had injections for chronic pain multiple sites). In May this year she reports her eyesight got screwed up, where she was seeing double and she had vertigo. Pt states, lights on the ceiling were down on the floor, and everything made her feel nauseated to the point she had to shut her eyes. She was given Meclizine at the time to tx symptoms and this helped some. Her vision has gradually improved since. Pt reports she does experience some headaches...about once a month and they don't last long. Pt reports hearing loss but has not had hearing tested. Her spouse is present for exam and reports he has noticed pt cannot hear him as well.. Regarding dizziness, pt reports she doesn't really experience spinning sensation, but doesn't like to be moved quickly as this can increase her symptoms. Pt reports she does experience light sensitivity during episodes and that she can  become so nauseated she vomits. She has poor balance, particularly in the dark. She cannot close her eyes in the shower due to LOB. Other symptoms: pt reports bilateral tinnitus, increased ear wax with recent L ear wax removal that might have helped symptoms.   Per chart PMH significant for DMII (on insulin), HTN, hx gastric bypass, chronic pain per pt report  PAIN:  Are you having pain? Pt does have neck pain, more on the R side than L side. She  describes it has sharp. Pt reports pain comes and goes, is unsure how long she has had that for  PRECAUTIONS: Sternal  RED FLAGS: Pt reports changes in ability to swallow water and pills over past year; she also reports urinary accidents >1 year but not as bad as before, states she has discussed with physicians    WEIGHT BEARING RESTRICTIONS: No  FALLS: Has patient fallen in last 6 months? No but she reports it's because she's been more cautious, moves sloly,, prior to that she was falling multiple times/week   LIVING ENVIRONMENT: Lives with: spouse, pets  Stairs: 2-3 with handrail on R  Has following equipment at home: Single point cane and Walker - 2 wheeled, grab bars in the shower and next to toilet, has a shower chair, has toilet riser  PLOF: Independent  PATIENT GOALS: to be able to walk better without constantly feeling she is going to fall, be able to balance in darkness, states she wants to be normal  OBJECTIVE:  Note: Objective measures were completed at Evaluation unless otherwise noted.  DIAGNOSTIC FINDINGS:   MRI BRAIN 03/07/24:  FINDINGS:   There are scattered and confluent foci of signal abnormality within the periventricular and deep white matter.  These are nonspecific but commonly seen with small vessel ischemic changes. Mild global parenchymal volume loss with ex vacuo ventricular dilation. Prominent perivascular spaces in the inferior basal ganglia bilaterally. There is no midline shift. No extra-axial fluid collection. No evidence of intracranial hemorrhage. No diffusion weighted signal abnormality to suggest acute infarct. No mass.  IMPRESSION: No acute intracranial abnormality. Chronic small vessel ischemic changes. Exam End: 03/07/24 17:00   Specimen Collected: 03/07/24 17:08 Last Resulted: 03/07/24 17:36  Received From: [facility]  Result Received: 04/17/24 13:14    ECG 03/07/24 Antonetta Okey Pac, MD - 03/08/2024 Formatting of this note might be  different from the original. SINUS RHYTHM WITH PREMATURE ATRIAL BEATS LEFT ANTERIOR FASCICULAR BLOCK MODERATE VOLTAGE CRITERIA FOR LVH, MAY BE NORMAL VARIANT ( R in aVL , [employer] product ) PROLONGED QT ABNORMAL ECG WHEN COMPARED WITH ECG OF 28-Nov-2023 21:38, PREMATURE ATRIAL BEATS ARE NOW PRESENT BORDERLINE CRITERIA FOR ANTERIOR INFARCT  ARE NO LONGER PRESENT BORDERLINE CRITERIA FOR ANTEROLATERAL INFARCT  ARE NO LONGER PRESENT Confirmed by Antonetta Okey ([id number]) on 03/08/2024 11:35:52 AM Specimen Collected: 03/07/24 14:49   Performed by: [facility] RAD Last Resulted: 03/08/24 11:35  Received From: [facility]  Result Received: 04/17/24 13:14    COGNITION: Overall cognitive status: Within functional limits for tasks assessed     POSTURE:  Rounded shoulders, increased thoracic kyphosis   Cervical ROM:   Deferred  STRENGTH:   LOWER EXTREMITY MMT: deferred  MMT Right eval Left eval  Hip flexion    Hip abduction    Hip adduction    Hip internal rotation    Hip external rotation    Knee flexion    Knee extension    Ankle dorsiflexion    Ankle plantarflexion  Ankle inversion    Ankle eversion    (Blank rows = not tested)  BED MOBILITY:  Needs to be formally assessed, likely with difficulty based on pt subjective hx intake  TRANSFERS: Assistive device utilized: None  Sit to stand: Modified independence increased BUE use, increased time Stand to sit: Modified independence Chair to chair: Modified independence   GAIT: Gait pattern: Pt with low back/L side hip pain, appears to offload LLE throughout gait, but overall increased lateral sway bilat throughout, decreased step-length, reaches out at times to touch wall to steady self  Distance walked: clinic distances Assistive device utilized: None *Reports on higher symptom days uses SPC or RW for gait   FUNCTIONAL TESTS: deferred to future visit/due to time 5 times sit to stand:   10 meter walk test:   Dynamic Gait  Index:    PATIENT SURVEYS:  DHI 82 from 04/26/2024  VESTIBULAR ASSESSMENT:   OCULOMOTOR EXAM:  Ocular Alignment: normal  Ocular ROM: No Limitations but testing does make pt slightly dizzy   Spontaneous Nystagmus: absent - does have end gaze nsytagmus   Gaze-Induced Nystagmus: appears present primary and secondary gaze bilat. Pt will need reassessment with vestibular goggles donned  Smooth Pursuits: overall smooth, some saccades   Saccades: intact  Convergence/Divergence: converges, sees double about 6 away    VESTIBULAR - OCULAR REFLEX:  04/26/2024  Slow VOR: some occ saccadic movement   VOR Cancellation: WNL   Head-Impulse Test: not completed due to neck pain    POSITIONAL TESTING: deferred    OTHOSTATICS: deferred                                                               TREATMENT DATE: 05/10/24  TA:  Nustep- lvl 1-2-1-2 over 5 min total. SPM 50s -70s   Seated march 3x10 each - cuing for eccentric control STS 6x, 2x5 from elevated height, use of UE Standing hip abduction 2x8 each LE - rates medium  Added to HEP and reviewed in session Access Code: [email or web address] URL: [email or web address] Date: 05/10/2024 Prepared by: Darryle Patten  Exercises - Sit to Stand with Counter Support  - 1 x daily - 5 x weekly - 3 sets - 5 reps  NMR:  for symptom modulation/habituation UT stretch 2x30 sec each side  Cervical flexors stretch 30 sec - provokes dizzy symptoms * Cervical extensors stretch 30 sec - provokes some dizzy symptoms *  Cervical rotation stretch 2x30 sec each side  Scapular squeezes 2x10 bilat UE  Reviewed plan for positional testing next visit/how to prepare       PATIENT EDUCATION: Education details: further assessment, exercise technique  Person educated: Patient Education method: Explanation Education comprehension: verbalized understanding  HOME EXERCISE PROGRAM: Access Code: [email or web address] URL: [email or web address] Date:  05/10/2024 Prepared by: Darryle Patten  Exercises - Sit to Stand with Counter Support  - 1 x daily - 5 x weekly - 3 sets - 5 reps   GOALS: Goals reviewed with patient? Yes initiated, will continue to review as further testing completed  SHORT TERM GOALS: Target date: 06/21/2024   Patient will be independent in home exercise program to improve dizziness and balance/mobility for better functional independence with ADLs. Baseline: Goal status: INITIAL   LONG TERM GOALS: Target date: 08/02/2024  Patient will reduce dizziness handicap inventory score to <50, for less dizziness with ADLs and increased safety with home and work tasks.  Baseline: 82 04/26/24 Goal status: INITIAL  2.  Patient will report at least a 30% reduction in positional or movement-induced dizziness symptoms to indicate increased ease with ADLs and QOL Baseline: pt symptomatic  Goal status: INITIAL  3.  Patient will increase dynamic gait index score to >19/24 as to demonstrate reduced fall risk and improved dynamic gait balance for better safety with community/home ambulation.   Baseline: 20/24 Goal status: INITIAL  4.  Patient will increase ABC scale score >80% to demonstrate better functional mobility and better confidence with ADLs.  Baseline: to be assessed Goal status: INITIAL  5. The pt will demonstrate independence with a home symptom management program to assist in decreasing or preventing further increase dizziness symptoms during a flare-up.  Baseline: no program currently in place   Goal status: INITIAL    ASSESSMENT:  CLINICAL IMPRESSION: Pt returns reporting improved period of symptoms and mobility. Session thus focused on conditioning and habituation activities. Pt with overall improved symptom tolerance for activities today, but still noted to have dizziness with cervical motions. Plan for positional testing next visit if pt able. Pt The pt will benefit from further skilled PT to improve  dizziness, balance, strength, gait and mobility to increase ease and safety with ADLS, QOL and decrease fall risk.  OBJECTIVE IMPAIRMENTS: Abnormal gait, decreased activity tolerance, decreased balance, decreased mobility, difficulty walking, decreased strength, dizziness, improper body mechanics, postural dysfunction, and pain.   ACTIVITY LIMITATIONS: standing, squatting, stairs, transfers, bed mobility, bathing, and locomotion level  PARTICIPATION LIMITATIONS: meal prep, cleaning, laundry, shopping, community activity, occupation, yard work, and pt reports general reduction in mobility and ability to complete ADLs due to symptoms  PERSONAL FACTORS: Age, Fitness, Sex, Time since onset of injury/illness/exacerbation, and 3+ comorbidities:  DMII (on insulin), HTN, chronic pain per pt report are also affecting patient's functional outcome.   REHAB POTENTIAL: Good  CLINICAL DECISION MAKING: Unstable/unpredictable  EVALUATION COMPLEXITY: High   PLAN:  PT FREQUENCY: 1-2x/week  PT DURATION: 12 weeks  PLANNED INTERVENTIONS: [id number]- PT Re-evaluation, 97750- Physical Performance Testing, 97110-Therapeutic exercises, 97530- Therapeutic activity, [id number]- Neuromuscular re-education, 97535- Self Care, [id number]- Manual therapy, [id number]- Gait training, [phone]- Orthotic Initial, [phone]- Orthotic/Prosthetic subsequent, [phone]- Canalith repositioning, Patient/Family education, Balance training, Stair training, Taping, Joint mobilization, Spinal mobilization, Vestibular training, DME instructions, Cryotherapy, and Moist heat  PLAN FOR NEXT SESSION:  POSITIONAL TEST NEXT VISIT  - sidelye test  Darryle Patten PT, DPT   Darryle JONELLE Patten, PT 05/10/2024, 5:18 PM

## 2024-05-15 ENCOUNTER — Ambulatory Visit

## 2024-05-17 ENCOUNTER — Telehealth: Payer: Self-pay

## 2024-05-17 ENCOUNTER — Ambulatory Visit

## 2024-05-17 NOTE — Telephone Encounter (Signed)
 Called pt about missed PT appointment. No answer but LVM about next appointment. Encouraged pt to call for future rescheduling or cancellation needs.

## 2024-05-22 ENCOUNTER — Ambulatory Visit

## 2024-05-24 ENCOUNTER — Ambulatory Visit

## 2024-05-24 DIAGNOSIS — R2681 Unsteadiness on feet: Secondary | ICD-10-CM | POA: Diagnosis not present

## 2024-05-24 DIAGNOSIS — R262 Difficulty in walking, not elsewhere classified: Secondary | ICD-10-CM

## 2024-05-24 DIAGNOSIS — M6281 Muscle weakness (generalized): Secondary | ICD-10-CM

## 2024-05-24 DIAGNOSIS — M542 Cervicalgia: Secondary | ICD-10-CM

## 2024-05-24 DIAGNOSIS — R42 Dizziness and giddiness: Secondary | ICD-10-CM

## 2024-05-24 NOTE — Therapy (Signed)
 OUTPATIENT PHYSICAL THERAPY TREATMENT  Patient Name: Courtney Foley MRN: [id number] DOB:05/15/1961, 63 y.o., female Today's Date: 05/24/2024  END OF SESSION:  PT End of Session - 05/24/24 1152     Visit Number 5    Number of Visits 25    Date for PT Re-Evaluation 07/12/24    Progress Note Due on Visit 10    PT Start Time 1145    PT Stop Time 1225    PT Time Calculation (min) 40 min    Equipment Utilized During Treatment Gait belt    Activity Tolerance Patient tolerated treatment well;Patient limited by pain    Behavior During Therapy WFL for tasks assessed/performed          Past Medical History:  Diagnosis Date   Diabetes mellitus without complication (HCC)    Hypertension    Past Surgical History:  Procedure Laterality Date   CHOLECYSTECTOMY     GASTRIC BYPASS     Patient Active Problem List   Diagnosis Date Noted   Chronic vomiting 06/11/2022   Hyperlipidemia associated with type 2 diabetes mellitus (HCC) 01/04/2022   Routine health maintenance 09/27/2021   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   Diaphoresis 05/13/2014   Dizziness 05/13/2014   Palpitations 05/13/2014   Diabetes mellitus (HCC) 03/04/2014   Hypertension associated with diabetes (HCC) 05/11/2013   Low back pain 05/11/2013   Obesity 05/11/2013   S/P gastric bypass 05/11/2013   PCP: Leopold Belvie RAMAN, MD REFERRING PROVIDER: Devra Channel, MD  REFERRING DIAG:  Diagnosis  R42 (ICD-10-CM) - Vertigo   THERAPY DIAG:  Muscle weakness (generalized)  Difficulty in walking, not elsewhere classified  Unsteadiness on feet  Dizziness and giddiness  Cervicalgia  ONSET DATE: 2023  Rationale for Evaluation and Treatment: Rehabilitation  SUBJECTIVE:   SUBJECTIVE STATEMENT: Pt was dizzy and nauseated for about 3 days following last session, also very sore in her bilat neck. Pt took her prn zofran  at home.   PERTINENT HISTORY:  The pt is a pleasant 63 y/o female presenting to PT for dizziness and  imbalance. Pt reports symptom onset two years ago. Pt says nothing conclusive was found at recent ENT visit (she describes being tested for BPPV deficit/positional maneuvers). She reports nothing found on imaging during hospitalization (5/7) where pt had presented to ED with positive HITNS exam (per chart). Pt reports CRM was provided by ENT and did make symptoms a little better. Pt says over past two years she's developed a few different symptoms: two years ago she started experiencing lots of pain, which turned into weakness and difficulty walking. She had some difficulty moving R shoulder as well (has had injections for chronic pain multiple sites). In May this year she reports her eyesight got screwed up, where she was seeing double and she had vertigo. Pt states, lights on the ceiling were down on the floor, and everything made her feel nauseated to the point she had to shut her eyes. She was given Meclizine at the time to tx symptoms and this helped some. Her vision has gradually improved since. Pt reports she does experience some headaches...about once a month and they don't last long. Pt reports hearing loss but has not had hearing tested. Her spouse is present for exam and reports he has noticed pt cannot hear him as well.. Regarding dizziness, pt reports she doesn't really experience spinning sensation, but doesn't like to be moved quickly as this can increase her symptoms. Pt reports she does experience light sensitivity during episodes and that  she can become so nauseated she vomits. She has poor balance, particularly in the dark. She cannot close her eyes in the shower due to LOB. Other symptoms: pt reports bilateral tinnitus, increased ear wax with recent L ear wax removal that might have helped symptoms.   PAIN:  Are you having pain? I always have pain, honey-  5-6/10 back  4/10 Rt posterior thigh/knee Other insignificant shoulder/knee pain.  PRECAUTIONS: Sternal  RED FLAGS: Pt reports  changes in ability to swallow water and pills over past year; she also reports urinary accidents >1 year but not as bad as before, states she has discussed with physicians    WEIGHT BEARING RESTRICTIONS: No  FALLS: Has patient fallen in last 6 months? No but she reports it's because she's been more cautious, moves sloly,, prior to that she was falling multiple times/week   LIVING ENVIRONMENT: Lives with: spouse, pets  Stairs: 2-3 with handrail on R  Has following equipment at home: Single point cane and Walker - 2 wheeled, grab bars in the shower and next to toilet, has a shower chair, has toilet riser  PLOF: Independent  PATIENT GOALS: to be able to walk better without constantly feeling she is going to fall, be able to balance in darkness, states she wants to be normal  OBJECTIVE:  Note: Objective measures were completed at evaluation unless otherwise noted.  DIAGNOSTIC FINDINGS:   MRI BRAIN 03/07/24:  FINDINGS:   There are scattered and confluent foci of signal abnormality within the periventricular and deep white matter.  These are nonspecific but commonly seen with small vessel ischemic changes. Mild global parenchymal volume loss with ex vacuo ventricular dilation. Prominent perivascular spaces in the inferior basal ganglia bilaterally. There is no midline shift. No extra-axial fluid collection. No evidence of intracranial hemorrhage. No diffusion weighted signal abnormality to suggest acute infarct. No mass.  IMPRESSION: No acute intracranial abnormality. Chronic small vessel ischemic changes. Exam End: 03/07/24 17:00   Specimen Collected: 03/07/24 17:08 Last Resulted: 03/07/24 17:36  Received From: [facility]  Result Received: 04/17/24 13:14    ECG 03/07/24 Antonetta Okey Pac, MD - 03/08/2024 Formatting of this note might be different from the original. SINUS RHYTHM WITH PREMATURE ATRIAL BEATS LEFT ANTERIOR FASCICULAR BLOCK MODERATE VOLTAGE CRITERIA FOR LVH, MAY  BE NORMAL VARIANT ( R in aVL , [employer] product ) PROLONGED QT ABNORMAL ECG WHEN COMPARED WITH ECG OF 28-Nov-2023 21:38, PREMATURE ATRIAL BEATS ARE NOW PRESENT BORDERLINE CRITERIA FOR ANTERIOR INFARCT  ARE NO LONGER PRESENT BORDERLINE CRITERIA FOR ANTEROLATERAL INFARCT  ARE NO LONGER PRESENT Confirmed by Antonetta Okey ([id number]) on 03/08/2024 11:35:52 AM Specimen Collected: 03/07/24 14:49   Performed by: [facility] Last Resulted: 03/08/24 11:35  Received From: [facility]  Result Received: 04/17/24 13:14     Cervical ROM:   Not formally assessed, but screened and noted to be grossly limited bilat Lt > Rt  BED MOBILITY:  Weakness, chronic low back pain, prefers some assist to prevent pain flareup, but able to otherwise if needed.   GAIT: Gait pattern: Pt with low back/L side hip pain, appears to offload LLE throughout gait, but overall increased lateral sway bilat throughout, decreased step-length, reaches out at times to touch wall to steady self  Distance walked: clinic distances Assistive device utilized: None *Reports on higher symptom days uses SPC or RW for gait   PATIENT SURVEYS:  DHI 82 from 04/26/2024  VESTIBULAR ASSESSMENT:  OCULOMOTOR EXAM:  Ocular Alignment: normal  Ocular ROM:  No Limitations but testing does make pt slightly dizzy   Spontaneous Nystagmus: absent - does have end gaze nsytagmus   Gaze-Induced Nystagmus: appears present primary and secondary gaze bilat. Pt will need reassessment with vestibular goggles donned  Smooth Pursuits: overall smooth, some saccades   Saccades: intact  Convergence/Divergence: converges, sees double about 6 away   VESTIBULAR - OCULAR REFLEX:  04/26/2024  Slow VOR: some occ saccadic movement   VOR Cancellation: WNL   Head-Impulse Test: not completed due to neck pain    POSITIONAL TESTING:   05/24/24: dix hallpike: negative bilat    OTHOSTATICS:  05/24/24: negative  LUE: 157/36mmHg  89bpm LUE  RUE: 136/93 mmHg 89bpm RUE   Standing BP: 173/122mmHg  98BPM LUE                                                                TREATMENT DATE: 05/24/24  Nustep: AA/ROM 6 minutes, level  LUE: 157/61mmHg  89bpm LUE  RUE: 136/93 mmHg 89bpm RUE  Standing BP: 173/124mmHg  98BPM LUE  LAQ 1x15 bilat in preparation for STS transfers STS from elevated plint x5 Seated marching bracing 1x10  Lateral side stepping 1x26ft bilat Standing Marching 1x20, alteranting (BUE support) getting dizzy)  STS from plinth x6    PATIENT EDUCATION: Education details: further assessment, exercise technique  Person educated: Patient Education method: Explanation Education comprehension: verbalized understanding  HOME EXERCISE PROGRAM: Access Code: [email or web address] URL: [email or web address] Date: 05/10/2024 Prepared by: Darryle Patten  Exercises - Sit to Stand with Counter Support  - 1 x daily - 5 x weekly - 3 sets - 5 reps  GOALS: Goals reviewed with patient? Yes initiated, will continue to review as further testing completed  SHORT TERM GOALS: Target date: 07/05/2024   Patient will be independent in home exercise program to improve dizziness and balance/mobility for better functional independence with ADLs. Baseline: Goal status: INITIAL  LONG TERM GOALS: Target date: 08/16/2024     Patient will reduce dizziness handicap inventory score to <50, for less dizziness with ADLs and increased safety with home and work tasks.  Baseline: 82 04/26/24 Goal status: INITIAL  2.  Patient will report at least a 30% reduction in positional or movement-induced dizziness symptoms to indicate increased ease with ADLs and QOL Baseline: pt symptomatic  Goal status: INITIAL  3.  Patient will increase dynamic gait index score to >19/24 as to demonstrate reduced fall risk and improved dynamic gait balance for better safety with community/home ambulation.  Baseline: 20/24 Goal status: INITIAL  4.  Patient will increase ABC scale score >80%  to demonstrate better functional mobility and better confidence with ADLs.  Baseline: to be assessed Goal status: INITIAL  5. The pt will demonstrate independence with a home symptom management program to assist in decreasing or preventing further increase dizziness symptoms during a flare-up.  Baseline: no program currently in place   Goal status: INITIAL   ASSESSMENT: CLINICAL IMPRESSION:  Pt very much flared for days following last session with neck/scapula stretches. Pt has (-) [employer] today, but does report some visual strobing wth Rt test, she reports this is typical with her HTN issues. Continued with balance interventions to mkae her balance better. Pt The pt will benefit from further skilled PT to improve  dizziness, balance, strength, gait and mobility to increase ease and safety with ADL, QOL and decrease fall risk.  OBJECTIVE IMPAIRMENTS: Abnormal gait, decreased activity tolerance, decreased balance, decreased mobility, difficulty walking, decreased strength, dizziness, improper body mechanics, postural dysfunction, and pain.   ACTIVITY LIMITATIONS: standing, squatting, stairs, transfers, bed mobility, bathing, and locomotion level  PARTICIPATION LIMITATIONS: meal prep, cleaning, laundry, shopping, community activity, occupation, yard work, and pt reports general reduction in mobility and ability to complete ADLs due to symptoms  PERSONAL FACTORS: Age, Fitness, Sex, Time since onset of injury/illness/exacerbation, and 3+ comorbidities:  DMII (on insulin), HTN, chronic pain per pt report are also affecting patient's functional outcome.   REHAB POTENTIAL: Good  CLINICAL DECISION MAKING: Unstable/unpredictable  EVALUATION COMPLEXITY: High   PLAN:  PT FREQUENCY: 1-2x/week  PT DURATION: 12 weeks  PLANNED INTERVENTIONS: [id number]- PT Re-evaluation, 97750- Physical Performance Testing, 97110-Therapeutic exercises, 97530- Therapeutic activity, [id number]- Neuromuscular re-education,  97535- Self Care, [id number]- Manual therapy, [id number]- Gait training, [phone]- Orthotic Initial, [phone]- Orthotic/Prosthetic subsequent, [phone]- Canalith repositioning, Patient/Family education, Balance training, Stair training, Taping, Joint mobilization, Spinal mobilization, Vestibular training, DME instructions, Cryotherapy, and Moist heat  PLAN FOR NEXT SESSION:  POSITIONAL TEST NEXT VISIT  - sidelye test    Meeka Cartelli C, PT 05/24/2024, 11:55 AM

## 2024-05-29 ENCOUNTER — Ambulatory Visit

## 2024-05-31 ENCOUNTER — Ambulatory Visit: Admitting: Physical Therapy

## 2024-05-31 DIAGNOSIS — R42 Dizziness and giddiness: Secondary | ICD-10-CM

## 2024-05-31 DIAGNOSIS — M6281 Muscle weakness (generalized): Secondary | ICD-10-CM

## 2024-05-31 DIAGNOSIS — R2681 Unsteadiness on feet: Secondary | ICD-10-CM | POA: Diagnosis not present

## 2024-05-31 DIAGNOSIS — M542 Cervicalgia: Secondary | ICD-10-CM

## 2024-05-31 DIAGNOSIS — R262 Difficulty in walking, not elsewhere classified: Secondary | ICD-10-CM

## 2024-05-31 NOTE — Therapy (Signed)
 OUTPATIENT PHYSICAL THERAPY TREATMENT  Patient Name: Courtney Foley MRN: [id number] DOB:1961-03-14, 63 y.o., female Today's Date: 05/31/2024  END OF SESSION:   PT End of Session - 05/31/24 1151     Visit Number 6    Number of Visits 25    Date for PT Re-Evaluation 07/12/24    Progress Note Due on Visit 10    PT Start Time 1150    PT Stop Time 1232    PT Time Calculation (min) 42 min    Equipment Utilized During Treatment Gait belt    Activity Tolerance Patient tolerated treatment well    Behavior During Therapy WFL for tasks assessed/performed           Past Medical History:  Diagnosis Date   Diabetes mellitus without complication (HCC)    Hypertension    Past Surgical History:  Procedure Laterality Date   CHOLECYSTECTOMY     GASTRIC BYPASS     Patient Active Problem List   Diagnosis Date Noted   Chronic vomiting 06/11/2022   Hyperlipidemia associated with type 2 diabetes mellitus (HCC) 01/04/2022   Routine health maintenance 09/27/2021   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   Diaphoresis 05/13/2014   Dizziness 05/13/2014   Palpitations 05/13/2014   Diabetes mellitus (HCC) 03/04/2014   Hypertension associated with diabetes (HCC) 05/11/2013   Low back pain 05/11/2013   Obesity 05/11/2013   S/P gastric bypass 05/11/2013   PCP: Leopold Belvie RAMAN, MD REFERRING PROVIDER: Devra Channel, MD  REFERRING DIAG:  Diagnosis  R42 (ICD-10-CM) - Vertigo   THERAPY DIAG:  Unsteadiness on feet  Difficulty in walking, not elsewhere classified  Muscle weakness (generalized)  Dizziness and giddiness  Cervicalgia  ONSET DATE: 2023  Rationale for Evaluation and Treatment: Rehabilitation  SUBJECTIVE:   SUBJECTIVE STATEMENT:   Pt reports she's been not bad since last therapy appointment. Pt states she has been doing her neck stretches the past few days and now her neck is sore. Pt states the neck exercises hurt and give me headaches. Pt states she has been trying  to do her leg exercises because her legs are weak.   Pt states when she has to turn her head then it makes her dizzy, especially when performing quick head movements. Pt states getting up too fast and leaning her head backwards causes her to be dizzy.   Pt describes walking in the dark as wonky and when tilting her head backwards she describes it as dizzy, but not as dizzy as when walking in the dark.   Pt states she has never been told she has decreased sensation in her feet during testing at MD office, but pt states she feels like she has decreased sensation.  Patient accompanied by: husband, Rob  PERTINENT HISTORY:  The pt is a pleasant 63 y/o female presenting to PT for dizziness and imbalance. Pt reports symptom onset two years ago. Pt says nothing conclusive was found at recent ENT visit (she describes being tested for BPPV deficit/positional maneuvers). She reports nothing found on imaging during hospitalization (5/7) where pt had presented to ED with positive HITNS exam (per chart). Pt reports CRM was provided by ENT and did make symptoms a little better. Pt says over past two years she's developed a few different symptoms: two years ago she started experiencing lots of pain, which turned into weakness and difficulty walking. She had some difficulty moving R shoulder as well (has had injections for chronic pain multiple sites). In May this year she reports her  eyesight got screwed up, where she was seeing double and she had vertigo. Pt states, lights on the ceiling were down on the floor, and everything made her feel nauseated to the point she had to shut her eyes. She was given Meclizine at the time to tx symptoms and this helped some. Her vision has gradually improved since. Pt reports she does experience some headaches...about once a month and they don't last long. Pt reports hearing loss but has not had hearing tested. Her spouse is present for exam and reports he has noticed pt  cannot hear him as well.. Regarding dizziness, pt reports she doesn't really experience spinning sensation, but doesn't like to be moved quickly as this can increase her symptoms. Pt reports she does experience light sensitivity during episodes and that she can become so nauseated she vomits. She has poor balance, particularly in the dark. She cannot close her eyes in the shower due to LOB. Other symptoms: pt reports bilateral tinnitus, increased ear wax with recent L ear wax removal that might have helped symptoms.   PAIN:  Are you having pain? I always have pain, honey-  5-6/10 back  4/10 Rt posterior thigh/knee Other insignificant shoulder/knee pain.  PRECAUTIONS: Sternal  RED FLAGS: Pt reports changes in ability to swallow water and pills over past year; she also reports urinary accidents >1 year but not as bad as before, states she has discussed with physicians    WEIGHT BEARING RESTRICTIONS: No  FALLS: Has patient fallen in last 6 months? No but she reports it's because she's been more cautious, moves sloly,, prior to that she was falling multiple times/week   LIVING ENVIRONMENT: Lives with: spouse, pets  Stairs: 2-3 with handrail on R  Has following equipment at home: Single point cane and Walker - 2 wheeled, grab bars in the shower and next to toilet, has a shower chair, has toilet riser  PLOF: Independent  PATIENT GOALS: to be able to walk better without constantly feeling she is going to fall, be able to balance in darkness, states she wants to be normal  OBJECTIVE:  Note: Objective measures were completed at evaluation unless otherwise noted.  DIAGNOSTIC FINDINGS:   MRI BRAIN 03/07/24:  FINDINGS:   There are scattered and confluent foci of signal abnormality within the periventricular and deep white matter.  These are nonspecific but commonly seen with small vessel ischemic changes. Mild global parenchymal volume loss with ex vacuo ventricular dilation. Prominent  perivascular spaces in the inferior basal ganglia bilaterally. There is no midline shift. No extra-axial fluid collection. No evidence of intracranial hemorrhage. No diffusion weighted signal abnormality to suggest acute infarct. No mass.  IMPRESSION: No acute intracranial abnormality. Chronic small vessel ischemic changes. Exam End: 03/07/24 17:00   Specimen Collected: 03/07/24 17:08 Last Resulted: 03/07/24 17:36  Received From: [facility]  Result Received: 04/17/24 13:14    ECG 03/07/24 Antonetta Okey Pac, MD - 03/08/2024 Formatting of this note might be different from the original. SINUS RHYTHM WITH PREMATURE ATRIAL BEATS LEFT ANTERIOR FASCICULAR BLOCK MODERATE VOLTAGE CRITERIA FOR LVH, MAY BE NORMAL VARIANT ( R in aVL , [employer] product ) PROLONGED QT ABNORMAL ECG WHEN COMPARED WITH ECG OF 28-Nov-2023 21:38, PREMATURE ATRIAL BEATS ARE NOW PRESENT BORDERLINE CRITERIA FOR ANTERIOR INFARCT  ARE NO LONGER PRESENT BORDERLINE CRITERIA FOR ANTEROLATERAL INFARCT  ARE NO LONGER PRESENT Confirmed by Antonetta Okey ([id number]) on 03/08/2024 11:35:52 AM Specimen Collected: 03/07/24 14:49   Performed by: [facility] Last Resulted: 03/08/24 11:35  Received From: [facility]  Result Received: 04/17/24 13:14     Cervical ROM:   Not formally assessed, but screened and noted to be grossly limited bilat Lt > Rt  BED MOBILITY:  Weakness, chronic low back pain, prefers some assist to prevent pain flareup, but able to otherwise if needed.   GAIT: Gait pattern: Pt with low back/L side hip pain, appears to offload LLE throughout gait, but overall increased lateral sway bilat throughout, decreased step-length, reaches out at times to touch wall to steady self  Distance walked: clinic distances Assistive device utilized: None *Reports on higher symptom days uses SPC or RW for gait   PATIENT SURVEYS:  DHI 82 from 04/26/2024  VESTIBULAR ASSESSMENT:  OCULOMOTOR EXAM:  Ocular Alignment:  normal  Ocular ROM: No Limitations but testing does make pt slightly dizzy   Spontaneous Nystagmus: absent - does have end gaze nsytagmus   Gaze-Induced Nystagmus: appears present primary and secondary gaze bilat. Pt will need reassessment with vestibular goggles donned  Smooth Pursuits: overall smooth, some saccades   Saccades: intact  Convergence/Divergence: converges, sees double about 6 away   VESTIBULAR - OCULAR REFLEX:  04/26/2024  Slow VOR: some occ saccadic movement   VOR Cancellation: WNL   Head-Impulse Test: not completed due to neck pain    POSITIONAL TESTING:   05/24/24: dix hallpike: negative bilat    OTHOSTATICS:  05/24/24: negative  LUE: 157/20mmHg  89bpm LUE  RUE: 136/93 mmHg 89bpm RUE  Standing BP: 173/159mmHg  98BPM LUE                                                                TREATMENT DATE: 05/31/24    Activities-specific Balance Confidence Scale:  Score: ___ Increased risk of falls in community-dwelling, older adults <80% (79.89%)  0% = no confidence - 100% = complete confidence (ANPTA Core Set of Outcome Measures for Adults with Neurologic Conditions, 2018)   Vitals (LUE) Sitting: BP 158/97 (MAP 110), HR 93bpm  Standing: BP 155/100 (MAP 112), HR 103bpm  *pt with pain from BP cuff squeezing her arm each time  Educated pt on recommendation to perform more consistent BP assessments at home and write it in a journal to take to her MD appointment. Also, educated pt on assessing her BP if/when she has another episode of sudden vision changes.  Pt reports she did not take meclizine today. Pt states taking it only when she feels dizzy.  Positional Testing:  R Sidelying Test: 2x negative; however, gets dizzy upon returning upright lasting <1 minute with no nystagmus L Sidelying Test: 2x negative for symptoms & nystagmus; however, gets dizzy upon returning upright lasting <1 minute with no nystagmus R Roll Test: negative x2 L Roll Test: negative x2  After  all positional testing, pt states she feels close to being dizzy.   No reports of neck pain during positional testing.  Educated pt on importance of increased water intake daily as pt states she stays dehydrated.   B UE and B LE reciprocal movement pattern on Nustep for cardiovascular training and B LE functional strengthening as well as for vestibular habituation against the following levels:  Level 1 resistance for 3 minutes Level 2 for 3 minutes Totaling 6 minutes and 257 steps Goal  steps per minute (SPM): no specific goal made this date     PATIENT EDUCATION: Education details: further assessment, exercise technique  Person educated: Patient Education method: Explanation Education comprehension: verbalized understanding  HOME EXERCISE PROGRAM: Access Code: [email or web address] URL: [email or web address] Date: 05/10/2024 Prepared by: Darryle Patten  Exercises - Sit to Stand with Counter Support  - 1 x daily - 5 x weekly - 3 sets - 5 reps  GOALS: Goals reviewed with patient? Yes initiated, will continue to review as further testing completed  SHORT TERM GOALS: Target date: 07/12/2024   Patient will be independent in home exercise program to improve dizziness and balance/mobility for better functional independence with ADLs. Baseline: initiated on 05/10/2024 Goal status: INITIAL  LONG TERM GOALS: Target date: 08/23/2024     Patient will reduce dizziness handicap inventory score to <50, for less dizziness with ADLs and increased safety with home and work tasks.  Baseline: 82 04/26/24 Goal status: INITIAL  2.  Patient will report at least a 30% reduction in positional or movement-induced dizziness symptoms to indicate increased ease with ADLs and QOL Baseline: pt symptomatic  Goal status: INITIAL  3.  Patient will increase dynamic gait index score to >19/24 as to demonstrate reduced fall risk and improved dynamic gait balance for better safety with community/home  ambulation.  Baseline: 20/24 Goal status: INITIAL  4.  Patient will increase ABC scale score >80% to demonstrate better functional mobility and better confidence with ADLs.  Baseline: _____ Goal status: INITIAL  5. The pt will demonstrate independence with a home symptom management program to assist in decreasing or preventing further increase dizziness symptoms during a flare-up.  Baseline: no program currently in place   Goal status: INITIAL   ASSESSMENT: CLINICAL IMPRESSION:   Patient reports doing neck exercises at home, but states she is sore because they hurt and will benefit from assessment of her form/technique and modification of the exercises in upcoming visits as needed. Therapist following up with recommended further positional vestibular testing from last session with pt having negative sidelying tests bilaterally and negative roll tests bilaterally; however, patient does report dizziness symptoms upon returning to sitting following each sidelying test. Therapist also re-assessed patient's BP with it noted to be elevated along with elevated HR - educated pt on importance of checking BP more consistently at home and keeping a log to give MD as well as importance of ensuring adequate daily water intake as pt states she stays dehydrated. Patient will benefit from further assessment to determine potential cervicogenic contributions to her dizziness as well as balance interventions specifically targeting reported imbalance when ambulating in the dark.  The pt will benefit from further skilled PT to improve dizziness, balance, strength, gait and mobility to increase ease and safety with ADL, QOL and decrease fall risk.   OBJECTIVE IMPAIRMENTS: Abnormal gait, decreased activity tolerance, decreased balance, decreased mobility, difficulty walking, decreased strength, dizziness, improper body mechanics, postural dysfunction, and pain.   ACTIVITY LIMITATIONS: standing, squatting, stairs,  transfers, bed mobility, bathing, and locomotion level  PARTICIPATION LIMITATIONS: meal prep, cleaning, laundry, shopping, community activity, occupation, yard work, and pt reports general reduction in mobility and ability to complete ADLs due to symptoms  PERSONAL FACTORS: Age, Fitness, Sex, Time since onset of injury/illness/exacerbation, and 3+ comorbidities:  DMII (on insulin), HTN, chronic pain per pt report are also affecting patient's functional outcome.   REHAB POTENTIAL: Good  CLINICAL DECISION MAKING: Unstable/unpredictable  EVALUATION COMPLEXITY: High   PLAN:  PT FREQUENCY: 1-2x/week  PT DURATION: 12 weeks  PLANNED INTERVENTIONS: [id number]- PT Re-evaluation, 97750- Physical Performance Testing, 97110-Therapeutic exercises, 97530- Therapeutic activity, [id number]- Neuromuscular re-education, 97535- Self Care, [id number]- Manual therapy, [phone]- Gait training, [phone]- Orthotic Initial, [phone]- Orthotic/Prosthetic subsequent, [phone]- Canalith repositioning, Patient/Family education, Balance training, Stair training, Taping, Joint mobilization, Spinal mobilization, Vestibular training, DME instructions, Cryotherapy, and Moist heat  PLAN FOR NEXT SESSION:  - review and update neck exercises b/c pt reports pain with them - further eval for potential cervicogenic contributions to dizziness - balance interventions focused on eyes closed b/c pt states most dizzy when in dark environments  - assess sensation due to hx of diabetes and pt subjectively reports feeling possible decrease in sensation - maybe perform motion sensitivity quotient? If dizziness continues with head/body movements   David Rodriquez, PT, DPT, NCS, CSRS Physical Therapist - [place]  [facility]  9:15 PM 05/31/24

## 2024-06-05 ENCOUNTER — Ambulatory Visit

## 2024-06-07 ENCOUNTER — Ambulatory Visit: Attending: Family Medicine

## 2024-06-07 DIAGNOSIS — M6281 Muscle weakness (generalized): Secondary | ICD-10-CM | POA: Insufficient documentation

## 2024-06-07 DIAGNOSIS — R42 Dizziness and giddiness: Secondary | ICD-10-CM | POA: Insufficient documentation

## 2024-06-07 DIAGNOSIS — R2681 Unsteadiness on feet: Secondary | ICD-10-CM | POA: Insufficient documentation

## 2024-06-07 DIAGNOSIS — R262 Difficulty in walking, not elsewhere classified: Secondary | ICD-10-CM | POA: Diagnosis present

## 2024-06-07 NOTE — Therapy (Signed)
 OUTPATIENT PHYSICAL THERAPY TREATMENT  Patient Name: Courtney Foley MRN: [id number] DOB:03/06/1961, 63 y.o., female Today's Date: 06/07/2024  END OF SESSION:   PT End of Session - 06/07/24 1217     Visit Number 7    Number of Visits 25    Date for PT Re-Evaluation 07/12/24    Authorization Type [place] Medicaid    Progress Note Due on Visit 10    PT Start Time 1148    PT Stop Time 1228    PT Time Calculation (min) 40 min    Activity Tolerance Patient tolerated treatment well;No increased pain           Past Medical History:  Diagnosis Date   Diabetes mellitus without complication (HCC)    Hypertension    Past Surgical History:  Procedure Laterality Date   CHOLECYSTECTOMY     GASTRIC BYPASS     Patient Active Problem List   Diagnosis Date Noted   Chronic vomiting 06/11/2022   Hyperlipidemia associated with type 2 diabetes mellitus (HCC) 01/04/2022   Routine health maintenance 09/27/2021   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   Diaphoresis 05/13/2014   Dizziness 05/13/2014   Palpitations 05/13/2014   Diabetes mellitus (HCC) 03/04/2014   Hypertension associated with diabetes (HCC) 05/11/2013   Low back pain 05/11/2013   Obesity 05/11/2013   S/P gastric bypass 05/11/2013   PCP: Leopold Belvie RAMAN, MD REFERRING PROVIDER: Devra Channel, MD  REFERRING DIAG:  Diagnosis  R42 (ICD-10-CM) - Vertigo   THERAPY DIAG:  Unsteadiness on feet  Difficulty in walking, not elsewhere classified  Muscle weakness (generalized)  Dizziness and giddiness  ONSET DATE: 2023  Rationale for Evaluation and Treatment: Rehabilitation  SUBJECTIVE:   SUBJECTIVE STATEMENT:   Pt doing well today, 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 Still working on neck stretches, she feels much better tolerated at this point. No knee pain today.   PERTINENT HISTORY:  The pt is a pleasant 63 y/o female presenting to PT for dizziness and imbalance. Pt reports symptom onset two years ago. Pt says nothing  conclusive was found at recent ENT visit (she describes being tested for BPPV deficit/positional maneuvers). She reports nothing found on imaging during hospitalization (5/7) where pt had presented to ED with positive HITNS exam (per chart). Pt reports CRM was provided by ENT and did make symptoms a little better. Pt says over past two years she's developed a few different symptoms: two years ago she started experiencing lots of pain, which turned into weakness and difficulty walking. She had some difficulty moving R shoulder as well (has had injections for chronic pain multiple sites). In May this year she reports her eyesight got screwed up, where she was seeing double and she had vertigo. Pt states, lights on the ceiling were down on the floor, and everything made her feel nauseated to the point she had to shut her eyes. She was given Meclizine at the time to tx symptoms and this helped some. Her vision has gradually improved since. Pt reports she does experience some headaches...about once a month and they don't last long. Pt reports hearing loss but has not had hearing tested. Her spouse is present for exam and reports he has noticed pt cannot hear him as well.. Regarding dizziness, pt reports she doesn't really experience spinning sensation, but doesn't like to be moved quickly as this can increase her symptoms. Pt reports she does experience light sensitivity during episodes and that she can become so nauseated she vomits. She has poor balance,  particularly in the dark. She cannot close her eyes in the shower due to LOB. Other symptoms: pt reports bilateral tinnitus, increased ear wax with recent L ear wax removal that might have helped symptoms.   PAIN:  Are you having pain? No pain today, just some in Right shoulder during one exercise, later DC   PRECAUTIONS: Sternal, falls   WEIGHT BEARING RESTRICTIONS: No  FALLS: Has patient fallen in last 6 months? No   LIVING ENVIRONMENT: Lives with:  spouse, pets  Stairs: 2-3 with handrail on R  Has following equipment at home: Single point cane and Walker - 2 wheeled, grab bars in the shower and next to toilet, has a shower chair, has toilet riser  PLOF: Independent  PATIENT GOALS: to be able to walk better without constantly feeling she is going to fall, be able to balance in darkness, states she wants to be normal  OBJECTIVE:  Note: Objective measures were completed at evaluation unless otherwise noted.  DIAGNOSTIC FINDINGS:   MRI BRAIN 03/07/24:  FINDINGS:   There are scattered and confluent foci of signal abnormality within the periventricular and deep white matter.  These are nonspecific but commonly seen with small vessel ischemic changes. Mild global parenchymal volume loss with ex vacuo ventricular dilation. Prominent perivascular spaces in the inferior basal ganglia bilaterally. There is no midline shift. No extra-axial fluid collection. No evidence of intracranial hemorrhage. No diffusion weighted signal abnormality to suggest acute infarct. No mass.  IMPRESSION: No acute intracranial abnormality. Chronic small vessel ischemic changes. Exam End: 03/07/24 17:00   Specimen Collected: 03/07/24 17:08 Last Resulted: 03/07/24 17:36  Received From: [facility]  Result Received: 04/17/24 13:14    ECG 03/07/24 Antonetta Okey Pac, MD - 03/08/2024 Formatting of this note might be different from the original. SINUS RHYTHM WITH PREMATURE ATRIAL BEATS LEFT ANTERIOR FASCICULAR BLOCK MODERATE VOLTAGE CRITERIA FOR LVH, MAY BE NORMAL VARIANT ( R in aVL , [employer] product ) PROLONGED QT ABNORMAL ECG WHEN COMPARED WITH ECG OF 28-Nov-2023 21:38, PREMATURE ATRIAL BEATS ARE NOW PRESENT BORDERLINE CRITERIA FOR ANTERIOR INFARCT  ARE NO LONGER PRESENT BORDERLINE CRITERIA FOR ANTEROLATERAL INFARCT  ARE NO LONGER PRESENT Confirmed by Antonetta Okey ([id number]) on 03/08/2024 11:35:52 AM Specimen Collected: 03/07/24 14:49   Performed by: [facility]  Last Resulted: 03/08/24 11:35  Received From: [facility]  Result Received: 04/17/24 13:14                                                                  TREATMENT DATE: 06/07/24  -AA/ROM and moderate cardiorespiratory loading x7 minutes -STS from plinth, hands free 2x10 -seated marching 1x03 from plinth, then 1x30 from dynadisc -eye closed on dynadisc seated 2x30sec  -eyes closed on dynadisc 2x30sec with horizontal head turns -seated on dynadisc ball chest press x15 -seated on dynadisc gaze stabilization with hor head turns 3x30sec  -seated on dynadisc with ball self toss/catch and eye tracking x2 minutes  -overground AMB 4x63ft with intermittent eyes closed as tolerated  PATIENT EDUCATION: Education details: further assessment, exercise technique  Person educated: Patient Education method: Explanation Education comprehension: verbalized understanding  HOME EXERCISE PROGRAM: Access Code: [email or web address] URL: [email or web address] Date: 05/10/2024 Prepared by: Darryle Patten  Exercises - Sit to Stand with Counter Support  -  1 x daily - 5 x weekly - 3 sets - 5 reps  GOALS: Goals reviewed with patient? Yes initiated, will continue to review as further testing completed  SHORT TERM GOALS: Target date: 07/19/2024   Patient will be independent in home exercise program to improve dizziness and balance/mobility for better functional independence with ADLs. Baseline: initiated on 05/10/2024 Goal status: INITIAL  LONG TERM GOALS: Target date: 08/30/2024     Patient will reduce dizziness handicap inventory score to <50, for less dizziness with ADLs and increased safety with home and work tasks.  Baseline: 82 04/26/24 Goal status: INITIAL  2.  Patient will report at least a 30% reduction in positional or movement-induced dizziness symptoms to indicate increased ease with ADLs and QOL Baseline: pt symptomatic  Goal status: INITIAL  3.  Patient will increase dynamic gait  index score to >19/24 as to demonstrate reduced fall risk and improved dynamic gait balance for better safety with community/home ambulation.  Baseline: 20/24 Goal status: INITIAL  4.  Patient will increase ABC scale score >80% to demonstrate better functional mobility and better confidence with ADLs.  Baseline: 39.375% Goal status: INITIAL  5. The pt will demonstrate independence with a home symptom management program to assist in decreasing or preventing further increase dizziness symptoms during a flare-up.  Baseline: no program currently in place   Goal status: INITIAL   ASSESSMENT: CLINICAL IMPRESSION:   Pt doing better with HEP at home regarding tolerance. Shifted toward more vestibular accomodation training this date with great tolerance, no substantial symptoms triggers. PT does better with eyes closed AMB than she expects. The pt will benefit from further skilled PT to improve dizziness, balance, strength, gait and mobility to increase ease and safety with ADL, QOL and decrease fall risk.   OBJECTIVE IMPAIRMENTS: Abnormal gait, decreased activity tolerance, decreased balance, decreased mobility, difficulty walking, decreased strength, dizziness, improper body mechanics, postural dysfunction, and pain.   ACTIVITY LIMITATIONS: standing, squatting, stairs, transfers, bed mobility, bathing, and locomotion level  PARTICIPATION LIMITATIONS: meal prep, cleaning, laundry, shopping, community activity, occupation, yard work, and pt reports general reduction in mobility and ability to complete ADLs due to symptoms  PERSONAL FACTORS: Age, Fitness, Sex, Time since onset of injury/illness/exacerbation, and 3+ comorbidities:  DMII (on insulin), HTN, chronic pain per pt report are also affecting patient's functional outcome.   REHAB POTENTIAL: Good  CLINICAL DECISION MAKING: Unstable/unpredictable  EVALUATION COMPLEXITY: High   PLAN:  PT FREQUENCY: 1-2x/week  PT DURATION: 12  weeks  PLANNED INTERVENTIONS: [id number]- PT Re-evaluation, 97750- Physical Performance Testing, 97110-Therapeutic exercises, 97530- Therapeutic activity, 97112- Neuromuscular re-education, 97535- Self Care, [id number]- Manual therapy, [phone]- Gait training, [phone]- Orthotic Initial, [phone]- Orthotic/Prosthetic subsequent, [phone]- Canalith repositioning, Patient/Family education, Balance training, Stair training, Taping, Joint mobilization, Spinal mobilization, Vestibular training, DME instructions, Cryotherapy, and Moist heat  PLAN FOR NEXT SESSION:  - balance interventions focused on eyes closed b/c pt states most dizzy when in dark environments    1:00 PM, 06/07/24 Peggye JAYSON Linear, PT, DPT Physical Therapist - [facility]  Outpatient Physical Therapy- Main Campus [phone]

## 2024-06-12 ENCOUNTER — Ambulatory Visit

## 2024-06-14 ENCOUNTER — Ambulatory Visit
